# Patient Record
Sex: Female | Born: 1985 | Race: Black or African American | Hispanic: No | Marital: Single | Smoking: Current every day smoker
Health system: Southern US, Community
[De-identification: ages and names within clinical notes are randomized; demographics above are authoritative.]

## PROBLEM LIST (undated history)

## (undated) DIAGNOSIS — J40 Bronchitis, not specified as acute or chronic: Secondary | ICD-10-CM

---

## 2011-04-23 ENCOUNTER — Emergency Department (HOSPITAL_BASED_OUTPATIENT_CLINIC_OR_DEPARTMENT_OTHER)
Admission: EM | Admit: 2011-04-23 | Discharge: 2011-04-23 | Disposition: A | Discharge status: Home / Self Care | Payer: Medicaid Other | Attending: Emergency Medicine | Admitting: Emergency Medicine

## 2011-04-23 ENCOUNTER — Encounter: Payer: Self-pay | Admitting: *Deleted

## 2011-04-23 DIAGNOSIS — F17200 Nicotine dependence, unspecified, uncomplicated: Secondary | ICD-10-CM | POA: Insufficient documentation

## 2011-04-23 DIAGNOSIS — J40 Bronchitis, not specified as acute or chronic: Secondary | ICD-10-CM

## 2011-04-23 MED ORDER — AZITHROMYCIN 250 MG PO TABS: ORAL_TABLET | ORAL | Status: DC

## 2011-04-23 MED ORDER — AZITHROMYCIN 250 MG PO TABS: 250.0000 mg | ORAL_TABLET | Freq: Every day | ORAL | Status: DC

## 2011-04-23 NOTE — ED Notes (Signed)
Patient states that she has been having congestion/coughing for the past week, yesterday started having HA, chills, some nausea, took nyquil last week, no relief

## 2011-04-23 NOTE — ED Provider Notes (Signed)
History     CSN: [id number] Arrival date & time: 04/23/2011  9:30 AM   Chief Complaint  Patient presents with  . Headache     (Include location/radiation/quality/duration/timing/severity/associated sxs/prior treatment) HPI Patient states that she has had nasal congestion and cough for several days. She feels worse now with some pressure in her face scratchy throat and body aches. She has had some subjective fever and chills. She is a Lawyer. She denies nausea vomiting or diarrhea but states she has not felt like eating or drinking as much as usual. She is a smoker and has continued to smoke somewhat throughout this period  History reviewed. No pertinent past medical history.   History reviewed. No pertinent past surgical history.  No family history on file.  History  Substance Use Topics  . Smoking status: Current Some Day Smoker  . Smokeless tobacco: Not on file  . Alcohol Use: No    OB History    Grav Para Term Preterm Abortions TAB SAB Ect Mult Living                  Review of Systems  All other systems reviewed and are negative.    Allergies  Review of patient's allergies indicates no known allergies.  Home Medications   Current Outpatient Rx  Name Route Sig Dispense Refill  . ESTRADIOL CYPIONATE 5 MG/ML IM OIL Intramuscular Inject into the muscle every 28 (twenty-eight) days.        Physical Exam    BP 127/80  Pulse 91  Temp(Src) 99 F (37.2 C) (Oral)  Resp 19  SpO2 99%  Physical Exam Well-developed well-nourished female sitting that is not appear in any acute distress heart rate is 90 respiratory rate is 19 blood pressure is 127/80 she is afebrile and her pulse ox 99% on room air HEENT normocephalic atraumatic eyes equal round reactive to light extraocular movements are intact nares are patent mouth mucous membranes are moist and her pharynx is clear TMs are clear bilaterally she does have some tenderness over her frontal sinuses and maxillary  sinuses. Neck is supple with shotty adenopathy trachea is midline carotid pulses are 2+ and equally Lungs are clear to auscultation Heart is regular rate and rhythm Abdomen soft and nontender Extremities appear normal with full active range of motion Neurologically patient is alert and oriented x3 ED Course  Procedures  No results found for this or any previous visit. No results found.   No diagnosis found.   MDM Patient appears to have some bronchitis and possibly some sinusitis. In the face of being a smoker and the extended time that the symptoms have lasted with discoloration of sputum she was placed on a Z-Pak.       Hilario Quarry, MD 04/23/11 [phone]

## 2011-04-23 NOTE — ED Notes (Signed)
Pt verbalizes care plan well with need for smoke cesstation

## 2011-09-19 ENCOUNTER — Emergency Department (HOSPITAL_BASED_OUTPATIENT_CLINIC_OR_DEPARTMENT_OTHER)
Admission: EM | Admit: 2011-09-19 | Discharge: 2011-09-19 | Disposition: A | Discharge status: Home / Self Care | Payer: Medicaid Other | Attending: Emergency Medicine | Admitting: Emergency Medicine

## 2011-09-19 ENCOUNTER — Encounter (HOSPITAL_BASED_OUTPATIENT_CLINIC_OR_DEPARTMENT_OTHER): Payer: Self-pay | Admitting: Family Medicine

## 2011-09-19 DIAGNOSIS — F17200 Nicotine dependence, unspecified, uncomplicated: Secondary | ICD-10-CM | POA: Insufficient documentation

## 2011-09-19 DIAGNOSIS — J988 Other specified respiratory disorders: Secondary | ICD-10-CM | POA: Insufficient documentation

## 2011-09-19 DIAGNOSIS — J9801 Acute bronchospasm: Secondary | ICD-10-CM | POA: Insufficient documentation

## 2011-09-19 DIAGNOSIS — J3489 Other specified disorders of nose and nasal sinuses: Secondary | ICD-10-CM | POA: Insufficient documentation

## 2011-09-19 MED ORDER — ALBUTEROL SULFATE HFA 108 (90 BASE) MCG/ACT IN AERS: 2.0000 | INHALATION_SPRAY | RESPIRATORY_TRACT | Status: DC | PRN

## 2011-09-19 MED ORDER — TRAMADOL HCL 50 MG PO TABS: 50.0000 mg | ORAL_TABLET | Freq: Four times a day (QID) | ORAL | Status: AC | PRN

## 2011-09-19 NOTE — ED Provider Notes (Signed)
History     CSN: [id number]  Arrival date & time 09/19/11  1119   First MD Initiated Contact with Patient 09/19/11 1258      Chief Complaint  Patient presents with  . Cough  . Nasal Congestion  . Generalized Body Aches    (Consider location/radiation/quality/duration/timing/severity/associated sxs/prior treatment) Patient is a 26 y.o. female presenting with cough. The history is provided by the patient.  Cough  She has been sick for the last 4 days with symptoms of a bad cold or flu. She's had a cough productive of sputum of varying color. There's been associated dyspnea, and subjective fevers, chills, sweats. She is complaining of generalized myalgias and arthralgias. There's been some clear rhinorrhea. She's also had nausea and vomiting and diarrhea. Symptoms are severe. Nothing makes them better and nothing makes them worse. She's tried taking Mucinex with no relief. She denies sick contacts and states she did get the flu shot this year.  History reviewed. No pertinent past medical history.  History reviewed. No pertinent past surgical history.  No family history on file.  History  Substance Use Topics  . Smoking status: Current Some Day Smoker  . Smokeless tobacco: Not on file  . Alcohol Use: No    OB History    Grav Para Term Preterm Abortions TAB SAB Ect Mult Living                  Review of Systems  Respiratory: Positive for cough.   All other systems reviewed and are negative.    Allergies  Review of patient's allergies indicates no known allergies.  Home Medications   Current Outpatient Rx  Name Route Sig Dispense Refill  . ESTRADIOL CYPIONATE 5 MG/ML IM OIL Intramuscular Inject into the muscle every 28 (twenty-eight) days.      . AZITHROMYCIN 250 MG PO TABS  Two po day one then one a day for four days. 6 tablet 0    BP 121/70  Pulse 80  Temp(Src) 98.9 F (37.2 C) (Oral)  Resp 16  Ht 5\' 1"  (1.549 m)  Wt 165 lb (74.844 kg)  BMI 31.18 kg/m2   SpO2 100%  Physical Exam  Nursing note and vitals reviewed.  26 year old female who is resting comfortably and in no acute distress. Vital signs are normal. Oxygen saturation is 100% which is normal. Head is normocephalic and atraumatic. PERRLA, EOMI. TMs are clear. There is no sinus tenderness. Examination of the nasal cavity shows no purulent drainage. Oropharynx is clear and mucous membranes are moist. Neck is nontender and supple without adenopathy. Back is nontender. Lungs have no rales or wheezes. Coarse rhonchi are present which are worse with forced exhalation. There is no chest wall tenderness. Heart has regular rate rhythm without murmur. Abdomen is soft, flat, nontender without masses or hepatosplenomegaly. Extremities have no cyanosis or edema, full range of motion is present. Skin is warm and dry without rash. Neurologic: Mental status is normal, cranial nerves are intact, there no focal motor or sensory deficits.  ED Course  Procedures (including critical care time)   1. Respiratory tract infection   2. Bronchospasm       MDM  Upper respiratory infection which is probably influenza. She is outside of the window to start antiviral medication for influenza so will need to be treated symptomatically. She will be given a prescription for an albuterol inhaler and also a prescription for tramadol for pain. She was offered to be given an albuterol nebulizer treatment  in the emergency department but stated that she had to leave to pick up her child from school and could not get the treatment here.        Dione Booze, MD 09/19/11 [phone]

## 2011-09-19 NOTE — Discharge Instructions (Signed)
Take Tylenol and/or ibuprofen as needed for less severe pain.  Bronchospasm, Adult Bronchospasm means that there is a spasm or tightening of the airways going into the lungs. Because the airways go into a spasm and get smaller it makes breathing more difficult. For reasons not completely known, workings (functions) of the airways designed to protect the lungs become over active. This causes the airways to become more sensitive to:  Infection.   Weather.   Exercise.   Irritants.   Things that cause allergic reactions or allergies (allergens).  Frequent coughing or respiratory episodes should be checked for the cause. This condition may be made worse by exercise. CAUSES  Inflammation is often the cause of this condition. Allergy, viral respiratory infections, or irritants in the air often cause this problem. Allergic reactions produce immediate and delayed responses. Late reactions may produce more serious inflammation. This may lead to increased reactivity of the airways. Sometimes this is inherited. Some common triggers are:  Allergies.   Infection commonly triggers attacks. Antibiotics are not helpful for viral infections and usually do not help with attacks of bronchospasm.   Exercise (running, etc.) can trigger an attack. Proper pre-exercise medications help most individuals participate in sports. Swimming is the least likely sport to cause problems.   Irritants (for example, pollution, cigarette smoke, strong odors, aerosol sprays, paint fumes, etc.) may trigger attacks. You cannot smoke and do not allow smoking in your home. This is absolutely necessary. Show this instruction to mates, relatives and significant others that may not agree with you.   Weather changes may cause lung problems but moving around trying to find an ideal climate does not seem to be overly helpful. Winds increase molds and pollens in the air. Rain refreshes the air by washing irritants out. Cold air may cause  irritation.   Emotional problems do not cause lung problems but can trigger attacks.  SYMPTOMS  Wheezing is the most common symptom. Frequent coughing (with or without exercise and or crying) and repeated respiratory infections are all early warning signs of bronchospasm. Chest tightness and shortness of breath are other symptoms. DIAGNOSIS  Early hidden bronchospasm may go for long periods of time without being detected. This is especially true if wheezing cannot be detected by your caregiver. Lung (pulmonary) function studies may help with diagnosis in these cases. HOME CARE INSTRUCTIONS   It is necessary to remain calm during an attack. Try to relax and breathe more slowly. During this time medications may be given. If any breathing problems seem to be getting worse and are unresponsive to treatment seek immediate medical care.   If you have severe breathing difficulty or have had a life threatening attack it is probably a good idea for you to learn how to give adrenaline (epi-pen) or use an anaphylaxis kit. Your caregiver can help you with this. These are the same kits carried by people who have severe allergic reactions. This is especially important if you do not have readily accessible medical care.   With any severe breathing problems where epinephrine (adrenaline) has been given at home call 911 immediately as the delayed reaction may be even more severe.  SEEK MEDICAL CARE IF:   There is wheezing and shortness of breath, even if medications are given to prevent attacks.   An oral temperature above 102 F (38.9 C) develops.   There are muscle aches, chest pain, or thickening of sputum.   The sputum changes from clear or white to yellow, green, gray, or  bloody.   There are problems that may be related to the medicine you are given, such as a rash, itching, swelling, or trouble breathing.  SEEK IMMEDIATE MEDICAL CARE IF:   The usual medicines do not stop your wheezing, or there is  increased coughing.   You have increased difficulty breathing.  MAKE SURE YOU:   Understand these instructions.   Will watch your condition.   Will get help right away if you are not doing well or get worse.  Document Released: 07/27/2003 Document Revised: 04/05/2011 Document Reviewed: 03/11/2008 [facility] Patient Information 2012 [place], [place].  Upper Respiratory Infection, Adult An upper respiratory infection (URI) is also sometimes known as the common cold. The upper respiratory tract includes the nose, sinuses, throat, trachea, and bronchi. Bronchi are the airways leading to the lungs. Most people improve within 1 week, but symptoms can last up to 2 weeks. A residual cough may last even longer.  CAUSES Many different viruses can infect the tissues lining the upper respiratory tract. The tissues become irritated and inflamed and often become very moist. Mucus production is also common. A cold is contagious. You can easily spread the virus to others by oral contact. This includes kissing, sharing a glass, coughing, or sneezing. Touching your mouth or nose and then touching a surface, which is then touched by another person, can also spread the virus. SYMPTOMS  Symptoms typically develop 1 to 3 days after you come in contact with a cold virus. Symptoms vary from person to person. They may include:  Runny nose.   Sneezing.   Nasal congestion.   Sinus irritation.   Sore throat.   Loss of voice (laryngitis).   Cough.   Fatigue.   Muscle aches.   Loss of appetite.   Headache.   Low-grade fever.  DIAGNOSIS  You might diagnose your own cold based on familiar symptoms, since most people get a cold 2 to 3 times a year. Your caregiver can confirm this based on your exam. Most importantly, your caregiver can check that your symptoms are not due to another disease such as strep throat, sinusitis, pneumonia, asthma, or epiglottitis. Blood tests, throat tests, and X-rays are not  necessary to diagnose a common cold, but they may sometimes be helpful in excluding other more serious diseases. Your caregiver will decide if any further tests are required. RISKS AND COMPLICATIONS  You may be at risk for a more severe case of the common cold if you smoke cigarettes, have chronic heart disease (such as heart failure) or lung disease (such as asthma), or if you have a weakened immune system. The very young and very old are also at risk for more serious infections. Bacterial sinusitis, middle ear infections, and bacterial pneumonia can complicate the common cold. The common cold can worsen asthma and chronic obstructive pulmonary disease (COPD). Sometimes, these complications can require emergency medical care and may be life-threatening. PREVENTION  The best way to protect against getting a cold is to practice good hygiene. Avoid oral or hand contact with people with cold symptoms. Wash your hands often if contact occurs. There is no clear evidence that vitamin C, vitamin E, echinacea, or exercise reduces the chance of developing a cold. However, it is always recommended to get plenty of rest and practice good nutrition. TREATMENT  Treatment is directed at relieving symptoms. There is no cure. Antibiotics are not effective, because the infection is caused by a virus, not by bacteria. Treatment may include:  Increased fluid intake.  Sports drinks offer valuable electrolytes, sugars, and fluids.   Breathing heated mist or steam (vaporizer or shower).   Eating chicken soup or other clear broths, and maintaining good nutrition.   Getting plenty of rest.   Using gargles or lozenges for comfort.   Controlling fevers with ibuprofen or acetaminophen as directed by your caregiver.   Increasing usage of your inhaler if you have asthma.  Zinc gel and zinc lozenges, taken in the first 24 hours of the common cold, can shorten the duration and lessen the severity of symptoms. Pain medicines  may help with fever, muscle aches, and throat pain. A variety of non-prescription medicines are available to treat congestion and runny nose. Your caregiver can make recommendations and may suggest nasal or lung inhalers for other symptoms.  HOME CARE INSTRUCTIONS   Only take over-the-counter or prescription medicines for pain, discomfort, or fever as directed by your caregiver.   Use a warm mist humidifier or inhale steam from a shower to increase air moisture. This may keep secretions moist and make it easier to breathe.   Drink enough water and fluids to keep your urine clear or pale yellow.   Rest as needed.   Return to work when your temperature has returned to normal or as your caregiver advises. You may need to stay home longer to avoid infecting others. You can also use a face mask and careful hand washing to prevent spread of the virus.  SEEK MEDICAL CARE IF:   After the first few days, you feel you are getting worse rather than better.   You need your caregiver's advice about medicines to control symptoms.   You develop chills, worsening shortness of breath, or brown or red sputum. These may be signs of pneumonia.   You develop yellow or brown nasal discharge or pain in the face, especially when you bend forward. These may be signs of sinusitis.   You develop a fever, swollen neck glands, pain with swallowing, or white areas in the back of your throat. These may be signs of strep throat.  SEEK IMMEDIATE MEDICAL CARE IF:   You have a fever.   You develop severe or persistent headache, ear pain, sinus pain, or chest pain.   You develop wheezing, a prolonged cough, cough up blood, or have a change in your usual mucus (if you have chronic lung disease).   You develop sore muscles or a stiff neck.  Document Released: 01/17/2001 Document Revised: 04/05/2011 Document Reviewed: 11/25/2010 [facility] Patient Information 2012 [place], [place].  Cough, Adult  A cough is a reflex that  helps clear your throat and airways. It can help heal the body or may be a reaction to an irritated airway. A cough may only last 2 or 3 weeks (acute) or may last more than 8 weeks (chronic).  CAUSES Acute cough:  Viral or bacterial infections.  Chronic cough:  Infections.   Allergies.   Asthma.   Post-nasal drip.   Smoking.   Heartburn or acid reflux.   Some medicines.   Chronic lung problems (COPD).   Cancer.  SYMPTOMS   Cough.   Fever.   Chest pain.   Increased breathing rate.   High-pitched whistling sound when breathing (wheezing).   Colored mucus that you cough up (sputum).  TREATMENT   A bacterial cough may be treated with antibiotic medicine.   A viral cough must run its course and will not respond to antibiotics.   Your caregiver may recommend other treatments  if you have a chronic cough.  HOME CARE INSTRUCTIONS   Only take over-the-counter or prescription medicines for pain, discomfort, or fever as directed by your caregiver. Use cough suppressants only as directed by your caregiver.   Use a cold steam vaporizer or humidifier in your bedroom or home to help loosen secretions.   Sleep in a semi-upright position if your cough is worse at night.   Rest as needed.   Stop smoking if you smoke.  SEEK IMMEDIATE MEDICAL CARE IF:   You have pus in your sputum.   Your cough starts to worsen.   You cannot control your cough with suppressants and are losing sleep.   You begin coughing up blood.   You have difficulty breathing.   You develop pain which is getting worse or is uncontrolled with medicine.   You have a fever.  MAKE SURE YOU:   Understand these instructions.   Will watch your condition.   Will get help right away if you are not doing well or get worse.  Document Released: 01/20/2011 Document Revised: 04/05/2011 Document Reviewed: 01/20/2011 [facility] Patient Information 2012 [place], [place].  Smoking Hazards Smoking cigarettes  is extremely bad for your health. Tobacco smoke has over 200 known poisons in it. There are over 60 chemicals in tobacco smoke that cause cancer. Some of the chemicals found in cigarette smoke include:   Cyanide.   Benzene.   Formaldehyde.   Methanol (wood alcohol).   Acetylene (fuel used in welding torches).   Ammonia.  Cigarette smoke also contains the poisonous gases nitrogen oxide and carbon monoxide.  Cigarette smokers have an increased risk of many serious medical problems, including:  Lung cancer.   Lung disease (such as pneumonia, bronchitis, and emphysema).   Heart attack and chest pain due to the heart not getting enough oxygen (angina).   Heart disease and peripheral blood vessel disease.   Hypertension.   Stroke.   Oral cancer (cancer of the lip, mouth, or voice box).   Bladder cancer.   Pancreatic cancer.   Cervical cancer.   Pregnancy complications, including premature birth.   Low birthweight babies.   Early menopause.   Lower estrogen level for women.   Infertility.   Facial wrinkles.   Blindness.   Increased risk of broken bones (fractures).   Senile dementia.   Stillbirths and smaller newborn babies, birth defects, and genetic damage to sperm.   Stomach ulcers and internal bleeding.  Children of smokers have an increased risk of the following, because of secondhand smoke exposure:   Sudden infant death syndrome (SIDS).   Respiratory infections.   Lung cancer.   Heart disease.   Ear infections.  Smoking causes approximately:  90% of all lung cancer deaths in men.   80% of all lung cancer deaths in women.   90% of deaths from chronic obstructive lung disease.  Compared with nonsmokers, smoking increases the risk of:  Coronary heart disease by 2 to 4 times.   Stroke by 2 to 4 times.   Men developing lung cancer by 23 times.   Women developing lung cancer by 13 times.   Dying from chronic obstructive lung diseases by  12 times.  Someone who smokes 2 packs a day loses about 8 years of his or her life. Even smoking lightly shortens your life expectancy by several years. You can greatly reduce the risk of medical problems for you and your family by stopping now. Smoking is the most preventable cause of  death and disease in our society. Within days of quitting smoking, your circulation returns to normal, you decrease the risk of having a heart attack, and your lung capacity improves. There may be some increased phlegm in the first few days after quitting, and it may take months for your lungs to clear up completely. Quitting for 10 years cuts your lung cancer risk to almost that of a nonsmoker. WHY IS SMOKING ADDICTIVE?  Nicotine is the chemical agent in tobacco that is capable of causing addiction or dependence.   When you smoke and inhale, nicotine is absorbed rapidly into the bloodstream through your lungs. Nicotine absorbed through the lungs is capable of creating a powerful addiction. Both inhaled and non-inhaled nicotine may be addictive.   Addiction studies of cigarettes and spit tobacco show that addiction to nicotine occurs mainly during the teen years, when young people begin using tobacco products.  WHAT ARE THE BENEFITS OF QUITTING?  There are many health benefits to quitting smoking.   Likelihood of developing cancer and heart disease decreases. Health improvements are seen almost immediately.   Blood pressure, pulse rate, and breathing patterns start returning to normal soon after quitting.   People who quit may see an improvement in their overall quality of life.  Some people choose to quit all at once. Other options include nicotine replacement products, such as patches, gum, and nasal sprays. Do not use these products without first checking with your caregiver. QUITTING SMOKING It is not easy to quit smoking. Nicotine is addicting, and longtime habits are hard to change. To start, you can write  down all your reasons for quitting, tell your family and friends you want to quit, and ask for their help. Throw your cigarettes away, chew gum or cinnamon sticks, keep your hands busy, and drink extra water or juice. Go for walks and practice deep breathing to relax. Think of all the money you are saving: around $1,000 a year, for the average pack-a-day smoker. Nicotine patches and gum have been shown to improve success at efforts to stop smoking. Zyban (bupropion) is an anti-depressant drug that can be prescribed to reduce nicotine withdrawal symptoms and to suppress the urge to smoke. Smoking is an addiction with both physical and psychological effects. Joining a stop-smoking support group can help you cope with the emotional issues. For more information and advice on programs to stop smoking, call your doctor, your local hospital, or these organizations:  American Lung Association - 1-800-LUNGUSA   American Cancer Society - 1-800-ACS-2345  Document Released: 08/31/2004 Document Revised: 04/05/2011 Document Reviewed: 05/05/2009 [facility] Patient Information 2012 [place], [place].  Albumin Albumin tests are done as a screen for a liver disorder or kidney disease or to check nutritional status, especially in hospitalized patients (prealbumin is sometimes used instead of albumin in this situation). Albumin is the most plentiful protein in the blood plasma. It keeps fluid from leaking out of blood vessels; nourishes tissues; and transports hormones, vitamins, drugs, and ions like calcium throughout the body. Albumin is made in the liver and is extremely sensitive to liver damage. The concentration of albumin drops when the liver is damaged, with kidney disease (nephrotic syndrome), when a person is malnourished, if a person experiences inflammation in the body, or with shock. Albumin increases when a person is dehydrated. PREPARATION FOR TEST No preparation or fasting is necessary. A blood sample is taken  by a needle from a vein. Tell the person doing the test if you are pregnant. NORMAL FINDINGS  Adult/Elderly  Total Protein: 6.4-8.3 g/dL or 16-10R/U (SI units)   Albumin; 3.5-5 g/dL or 04-54 g/L (SI units)   Globulin 2.3-3.4 g/dL   Alpha1 globulin: 0.9-8 g/dL or 1-3 g/L (SI units)   Alpha2 globulin: 0.6-1 g/dL JX9-14 g/L (SI units)   Beta globulin: 0.7-1.1 g/dL or 7-82 g/L (SI units)  Children  Total protein.   Premature infant: 4.2-7.6 g/L   Newborn: 4.6-7.4 g/dL   Infant: 9-5.6 g/L   Albumin.   Premature infant: 3-4.2 g/dL   Newborn: 2.1-3.0 g/dL   Infant: 8.6-5.7 g/dL   Child: 8-4.6 g/dL  Ranges for normal findings may vary among different laboratories and hospitals. You should always check with your doctor after having lab work or other tests done to discuss the meaning of your test results and whether your values are considered within normal limits. MEANING OF TEST  Your caregiver will go over the test results with you and discuss the importance and meaning of your results, as well as treatment options and the need for additional tests if necessary. OBTAINING THE TEST RESULTS It is your responsibility to obtain your test results. Ask the lab or department performing the test when and how you will get your results. Document Released: 08/15/2004 Document Revised: 04/05/2011 Document Reviewed: 06/28/2008 [facility] Patient Information 2012 [place], [place].  Tramadol tablets What is this medicine? TRAMADOL (TRA ma dole) is a pain reliever. It is used to treat moderate to severe pain in adults. This medicine may be used for other purposes; ask your health care provider or pharmacist if you have questions. What should I tell my health care provider before I take this medicine? They need to know if you have any of these conditions: -brain tumor -depression -drug abuse or addiction -head injury -if you frequently drink alcohol containing drinks -kidney disease or  trouble passing urine -liver disease -lung disease, asthma, or breathing problems -seizures or epilepsy -suicidal thoughts, plans, or attempt; a previous suicide attempt by you or a family member -an unusual or allergic reaction to tramadol, codeine, other medicines, foods, dyes, or preservatives -pregnant or trying to get pregnant -breast-feeding How should I use this medicine? Take this medicine by mouth with a full glass of water. Follow the directions on the prescription label. If the medicine upsets your stomach, take it with food or milk. Do not take more medicine than you are told to take. Talk to your pediatrician regarding the use of this medicine in children. Special care may be needed. Overdosage: If you think you have taken too much of this medicine contact a poison control center or emergency room at once. NOTE: This medicine is only for you. Do not share this medicine with others. What if I miss a dose? If you miss a dose, take it as soon as you can. If it is almost time for your next dose, take only that dose. Do not take double or extra doses. What may interact with this medicine? Do not take this medicine with any of the following medications: -MAOIs like Carbex, Eldepryl, Marplan, Nardil, and Parnate This medicine may also interact with the following medications: -alcohol or medicines that contain alcohol -antihistamines -benzodiazepines -bupropion -carbamazepine or oxcarbazepine -clozapine -cyclobenzaprine -digoxin -furazolidone -linezolid -medicines for depression, anxiety, or psychotic disturbances -medicines for migraine headache like almotriptan, eletriptan, frovatriptan, naratriptan, rizatriptan, sumatriptan, zolmitriptan -medicines for pain like pentazocine, buprenorphine, butorphanol, meperidine, nalbuphine, and propoxyphene -medicines for sleep -muscle relaxants -naltrexone -phenobarbital -phenothiazines like perphenazine, thioridazine, chlorpromazine,  mesoridazine, fluphenazine, prochlorperazine,  promazine, and trifluoperazine -procarbazine -warfarin This list may not describe all possible interactions. Give your health care provider a list of all the medicines, herbs, non-prescription drugs, or dietary supplements you use. Also tell them if you smoke, drink alcohol, or use illegal drugs. Some items may interact with your medicine. What should I watch for while using this medicine? Tell your doctor or health care professional if your pain does not go away, if it gets worse, or if you have new or a different type of pain. You may develop tolerance to the medicine. Tolerance means that you will need a higher dose of the medicine for pain relief. Tolerance is normal and is expected if you take this medicine for a long time. Do not suddenly stop taking your medicine because you may develop a severe reaction. Your body becomes used to the medicine. This does NOT mean you are addicted. Addiction is a behavior related to getting and using a drug for a non-medical reason. If you have pain, you have a medical reason to take pain medicine. Your doctor will tell you how much medicine to take. If your doctor wants you to stop the medicine, the dose will be slowly lowered over time to avoid any side effects. You may get drowsy or dizzy. Do not drive, use machinery, or do anything that needs mental alertness until you know how this medicine affects you. Do not stand or sit up quickly, especially if you are an older patient. This reduces the risk of dizzy or fainting spells. Alcohol can increase or decrease the effects of this medicine. Avoid alcoholic drinks. You may have constipation. Try to have a bowel movement at least every 2 to 3 days. If you do not have a bowel movement for 3 days, call your doctor or health care professional. Your mouth may get dry. Chewing sugarless gum or sucking hard candy, and drinking plenty of water may help. Contact your doctor if the  problem does not go away or is severe. What side effects may I notice from receiving this medicine? Side effects that you should report to your doctor or health care professional as soon as possible: -allergic reactions like skin rash, itching or hives, swelling of the face, lips, or tongue -breathing difficulties, wheezing -confusion -itching -light headedness or fainting spells -redness, blistering, peeling or loosening of the skin, including inside the mouth -seizures Side effects that usually do not require medical attention (report to your doctor or health care professional if they continue or are bothersome): -constipation -dizziness -drowsiness -headache -nausea, vomiting This list may not describe all possible side effects. Call your doctor for medical advice about side effects. You may report side effects to FDA at 1-800-FDA-1088. Where should I keep my medicine? Keep out of the reach of children. Store at room temperature between 15 and 30 degrees C (59 and 86 degrees F). Keep container tightly closed. Throw away any unused medicine after the expiration date. NOTE: This sheet is a summary. It may not cover all possible information. If you have questions about this medicine, talk to your doctor, pharmacist, or health care provider.  2012, [employer] Standard. (04/06/2010 11:55:44 AM)

## 2011-09-19 NOTE — ED Notes (Signed)
Pt c/o cough, congestion, body aches since Friday. Pt reports taking multiple otc meds for same.

## 2012-03-14 ENCOUNTER — Emergency Department (HOSPITAL_BASED_OUTPATIENT_CLINIC_OR_DEPARTMENT_OTHER)
Admission: EM | Admit: 2012-03-14 | Discharge: 2012-03-14 | Disposition: A | Discharge status: Home / Self Care | Payer: Medicaid Other | Attending: Emergency Medicine | Admitting: Emergency Medicine

## 2012-03-14 ENCOUNTER — Encounter (HOSPITAL_BASED_OUTPATIENT_CLINIC_OR_DEPARTMENT_OTHER): Payer: Self-pay

## 2012-03-14 DIAGNOSIS — H00015 Hordeolum externum left lower eyelid: Secondary | ICD-10-CM

## 2012-03-14 DIAGNOSIS — H00019 Hordeolum externum unspecified eye, unspecified eyelid: Secondary | ICD-10-CM | POA: Insufficient documentation

## 2012-03-14 DIAGNOSIS — F17200 Nicotine dependence, unspecified, uncomplicated: Secondary | ICD-10-CM | POA: Insufficient documentation

## 2012-03-14 MED ORDER — FLUORESCEIN SODIUM 1 MG OP STRP
ORAL_STRIP | OPHTHALMIC | Status: AC
  Administered 2012-03-14: 1
  Filled 2012-03-14: qty 1

## 2012-03-14 MED ORDER — ERYTHROMYCIN 5 MG/GM OP OINT: TOPICAL_OINTMENT | OPHTHALMIC | Status: AC

## 2012-03-14 MED ORDER — TETRACAINE HCL 0.5 % OP SOLN
OPHTHALMIC | Status: AC
  Administered 2012-03-14: 16:00:00
  Filled 2012-03-14: qty 2

## 2012-03-14 NOTE — ED Provider Notes (Signed)
History     CSN: [id number]  Arrival date & time 03/14/12  1525   First MD Initiated Contact with Patient 03/14/12 1530      Chief Complaint  Patient presents with  . Eye Pain    (Consider location/radiation/quality/duration/timing/severity/associated sxs/prior treatment) Patient is a 26 y.o. female presenting with eye pain. The history is provided by the patient.  Eye Pain This is a new problem. The current episode started in the past 7 days. The problem occurs constantly. The problem has been unchanged. Associated symptoms include a visual change. Pertinent negatives include no chills, congestion, diaphoresis, fever, headaches, neck pain, numbness, rash, sore throat or vomiting.  Pt states left eye is tender, swollen. States swelling worse in the morning. No drainage. Pain worsened with blinking, palpation. Denies injury. Denies photophobia. Denies headache, nausea, vomiting. No hx of the same. Does not wear contacts or glasses.   History reviewed. No pertinent past medical history.  History reviewed. No pertinent past surgical history.  No family history on file.  History  Substance Use Topics  . Smoking status: Current Some Day Smoker  . Smokeless tobacco: Not on file  . Alcohol Use: No    OB History    Grav Para Term Preterm Abortions TAB SAB Ect Mult Living                  Review of Systems  Constitutional: Negative for fever, chills and diaphoresis.  HENT: Negative for congestion, sore throat, rhinorrhea, neck pain and neck stiffness.   Eyes: Positive for pain and visual disturbance. Negative for photophobia, discharge, redness and itching.  Respiratory: Negative.   Cardiovascular: Negative.   Gastrointestinal: Negative for vomiting.  Skin: Negative for rash.  Neurological: Negative for dizziness, numbness and headaches.    Allergies  Review of patient's allergies indicates no known allergies.  Home Medications   Current Outpatient Rx  Name Route Sig  Dispense Refill  . ALBUTEROL SULFATE HFA 108 (90 BASE) MCG/ACT IN AERS Inhalation Inhale 2 puffs into the lungs every 4 (four) hours as needed for wheezing or shortness of breath (or cough). 1 Inhaler 0  . AZITHROMYCIN 250 MG PO TABS  Two po day one then one a day for four days. 6 tablet 0  . ESTRADIOL CYPIONATE 5 MG/ML IM OIL Intramuscular Inject into the muscle every 28 (twenty-eight) days.        BP 126/71  Pulse 80  Temp 98.7 F (37.1 C) (Oral)  Resp 16  Ht 5\' 1"  (1.549 m)  Wt 165 lb (74.844 kg)  BMI 31.18 kg/m2  SpO2 98%  Physical Exam  Nursing note and vitals reviewed. Constitutional: She is oriented to person, place, and time. She appears well-developed and well-nourished. No distress.  HENT:  Head: Normocephalic and atraumatic.  Right Ear: External ear normal.  Left Ear: External ear normal.  Nose: Nose normal.  Mouth/Throat: Oropharynx is clear and moist.  Eyes: Conjunctivae and EOM are normal. Pupils are equal, round, and reactive to light.       There is a sty - area swelling, tenderness to the left lower medial eyelid. No drainage. Fluorescein stain of left eye negative  Neck: Neck supple.  Cardiovascular: Normal rate, regular rhythm and normal heart sounds.   Pulmonary/Chest: Effort normal and breath sounds normal. No respiratory distress. She has no wheezes. She has no rales.  Lymphadenopathy:    She has no cervical adenopathy.  Neurological: She is alert and oriented to person, place, and time.  Skin: Skin is warm and dry.  Psychiatric: She has a normal mood and affect.    ED Course  Procedures (including critical care time)  Exam consistent with a hordeolum of left eye. Negative fluorescein stain. Visual acuity normal. Will treat with erythromycin topical, warm compresses, if worsening follow up with ophthalmology. No evidence of orbital or periorbital cellulitis.   1. Hordeolum externum of left lower eyelid       MDM         Lottie Mussel, PA 03/14/12 2233

## 2012-03-14 NOTE — ED Notes (Signed)
C/o left eye pain, swelling x 4 days-denies injury

## 2012-03-16 NOTE — ED Provider Notes (Signed)
Medical screening examination/treatment/procedure(s) were performed by non-physician practitioner and as supervising physician I was immediately available for consultation/collaboration.   Shaletha Humble, MD 03/16/12 0720 

## 2012-08-05 ENCOUNTER — Encounter (HOSPITAL_BASED_OUTPATIENT_CLINIC_OR_DEPARTMENT_OTHER): Payer: Self-pay

## 2012-08-05 ENCOUNTER — Emergency Department (HOSPITAL_BASED_OUTPATIENT_CLINIC_OR_DEPARTMENT_OTHER)
Admission: EM | Admit: 2012-08-05 | Discharge: 2012-08-05 | Disposition: A | Discharge status: Home / Self Care | Payer: Medicaid Other | Attending: Emergency Medicine | Admitting: Emergency Medicine

## 2012-08-05 DIAGNOSIS — R1110 Vomiting, unspecified: Secondary | ICD-10-CM

## 2012-08-05 DIAGNOSIS — F17200 Nicotine dependence, unspecified, uncomplicated: Secondary | ICD-10-CM | POA: Insufficient documentation

## 2012-08-05 DIAGNOSIS — Z3202 Encounter for pregnancy test, result negative: Secondary | ICD-10-CM | POA: Insufficient documentation

## 2012-08-05 DIAGNOSIS — M549 Dorsalgia, unspecified: Secondary | ICD-10-CM

## 2012-08-05 DIAGNOSIS — R05 Cough: Secondary | ICD-10-CM | POA: Insufficient documentation

## 2012-08-05 DIAGNOSIS — IMO0002 Reserved for concepts with insufficient information to code with codable children: Secondary | ICD-10-CM | POA: Insufficient documentation

## 2012-08-05 DIAGNOSIS — R109 Unspecified abdominal pain: Secondary | ICD-10-CM | POA: Insufficient documentation

## 2012-08-05 DIAGNOSIS — B9789 Other viral agents as the cause of diseases classified elsewhere: Secondary | ICD-10-CM | POA: Insufficient documentation

## 2012-08-05 DIAGNOSIS — Z79899 Other long term (current) drug therapy: Secondary | ICD-10-CM | POA: Insufficient documentation

## 2012-08-05 DIAGNOSIS — R51 Headache: Secondary | ICD-10-CM | POA: Insufficient documentation

## 2012-08-05 DIAGNOSIS — B349 Viral infection, unspecified: Secondary | ICD-10-CM

## 2012-08-05 DIAGNOSIS — R059 Cough, unspecified: Secondary | ICD-10-CM | POA: Insufficient documentation

## 2012-08-05 DIAGNOSIS — R0602 Shortness of breath: Secondary | ICD-10-CM | POA: Insufficient documentation

## 2012-08-05 DIAGNOSIS — IMO0001 Reserved for inherently not codable concepts without codable children: Secondary | ICD-10-CM | POA: Insufficient documentation

## 2012-08-05 LAB — URINALYSIS, ROUTINE W REFLEX MICROSCOPIC
Glucose, UA: NEGATIVE mg/dL
Leukocytes, UA: NEGATIVE
Protein, ur: NEGATIVE mg/dL
Urobilinogen, UA: 1 mg/dL (ref 0.0–1.0)

## 2012-08-05 LAB — URINE MICROSCOPIC-ADD ON

## 2012-08-05 LAB — PREGNANCY, URINE: Preg Test, Ur: NEGATIVE

## 2012-08-05 MED ORDER — ACETAMINOPHEN-CODEINE #3 300-30 MG PO TABS: 1.0000 | ORAL_TABLET | Freq: Four times a day (QID) | ORAL | Status: DC | PRN

## 2012-08-05 MED ORDER — ACETAMINOPHEN 325 MG PO TABS
650.0000 mg | ORAL_TABLET | Freq: Once | ORAL | Status: AC
  Administered 2012-08-05: 650 mg via ORAL
  Filled 2012-08-05: qty 1

## 2012-08-05 MED ORDER — CYCLOBENZAPRINE HCL 10 MG PO TABS: 10.0000 mg | ORAL_TABLET | Freq: Two times a day (BID) | ORAL | Status: DC | PRN

## 2012-08-05 MED ORDER — IBUPROFEN 400 MG PO TABS
600.0000 mg | ORAL_TABLET | Freq: Once | ORAL | Status: AC
  Administered 2012-08-05: 600 mg via ORAL
  Filled 2012-08-05: qty 1

## 2012-08-05 NOTE — ED Notes (Signed)
C/o vomiting, HA, back pain x 3 days

## 2012-08-05 NOTE — ED Provider Notes (Addendum)
History  This chart was scribed for Tobin Chad, MD by Manuela Schwartz, ED scribe. This patient was seen in room MH02/MH02 and the patient's care was started at 1902.   CSN: [id number]  Arrival date & time 08/05/12  1902   First MD Initiated Contact with Patient 08/05/12 2141      Chief Complaint  Patient presents with  . Emesis   Patient is a 26 y.o. female presenting with vomiting. The history is provided by the patient. No language interpreter was used.  Emesis  This is a new problem. The current episode started more than 2 days ago. The problem occurs 2 to 4 times per day. The problem has not changed since onset.The emesis has an appearance of stomach contents. There has been no fever. Associated symptoms include abdominal pain, cough and headaches. Pertinent negatives include no chills, no diarrhea and no fever.   Kristy Byrd is a 26 y.o. female who presents to the Emergency Department complaining of multiple medical complaints including constant gradually body aches, emesis, and mild cough. She states over the past 3 days has been having HA, entire upper and lower back pain, multiple emesis episodes, nausea, emesis, SOB, upper diffuse abdominal pain. She is on depo provera, has no chronic medical problems. She denies fever, rhinorrhea, sore throat, diarrhea. She has no sick contacts, or allergies.   She has no PCP  History reviewed. No pertinent past medical history.  History reviewed. No pertinent past surgical history.  No family history on file.  History  Substance Use Topics  . Smoking status: Current Some Day Smoker  . Smokeless tobacco: Not on file  . Alcohol Use: No    OB History    Grav Para Term Preterm Abortions TAB SAB Ect Mult Living                  Review of Systems  Constitutional: Negative for fever and chills.  HENT: Negative for congestion, sore throat, rhinorrhea, trouble swallowing, neck pain and neck stiffness.   Respiratory: Positive for  cough. Negative for chest tightness and shortness of breath.   Cardiovascular: Negative for chest pain.  Gastrointestinal: Positive for vomiting and abdominal pain. Negative for nausea and diarrhea.  Genitourinary: Negative for dysuria, urgency, hematuria, flank pain and pelvic pain.  Musculoskeletal: Positive for back pain.  Skin: Negative for color change, rash and wound.  Neurological: Positive for headaches. Negative for weakness.  All other systems reviewed and are negative.    Allergies  Review of patient's allergies indicates no known allergies.  Home Medications   Current Outpatient Rx  Name  Route  Sig  Dispense  Refill  . ALBUTEROL SULFATE HFA 108 (90 BASE) MCG/ACT IN AERS   Inhalation   Inhale 2 puffs into the lungs every 4 (four) hours as needed for wheezing or shortness of breath (or cough).   1 Inhaler   0   . AZITHROMYCIN 250 MG PO TABS      Two po day one then one a day for four days.   6 tablet   0   . ESTRADIOL CYPIONATE 5 MG/ML IM OIL   Intramuscular   Inject into the muscle every 28 (twenty-eight) days.             Triage Vitals: BP 124/85  Pulse 93  Temp 98.2 F (36.8 C) (Oral)  Resp 16  Wt 167 lb (75.751 kg)  SpO2 100%  Physical Exam  Nursing note and vitals reviewed. Constitutional:  She is oriented to person, place, and time. She appears well-developed and well-nourished. No distress.  HENT:  Head: Normocephalic and atraumatic.  Mouth/Throat: Oropharynx is clear and moist.       Posterior pharynx is clear  Eyes: EOM are normal.  Neck: Neck supple. No JVD present. No tracheal deviation present.  Cardiovascular: Normal rate, regular rhythm and normal heart sounds.   No murmur heard.      Normal S1/S2  Pulmonary/Chest: Effort normal. No respiratory distress. She has no wheezes. She has no rales.  Abdominal: Soft. Bowel sounds are normal. She exhibits no distension and no mass. There is no tenderness. There is no rebound and no guarding.    Musculoskeletal: Normal range of motion. She exhibits no edema and no tenderness.       Cervical back: Normal.       Thoracic back: Normal.       Lumbar back: Normal.       No midline back tenderness or step offs  Neurological: She is alert and oriented to person, place, and time. Coordination (note steady, confident gait) normal.  Skin: Skin is warm and dry. No rash noted. No erythema.  Psychiatric: She has a normal mood and affect. Her behavior is normal.    ED Course  Procedures (including critical care time) DIAGNOSTIC STUDIES: Oxygen Saturation is 100% on room air, normal by my interpretation.    COORDINATION OF CARE: At 1000 PM Discussed treatment plan with patient which includes UA, preg UA. Patient agrees.   Labs Reviewed  URINALYSIS, ROUTINE W REFLEX MICROSCOPIC - Abnormal; Notable for the following:    APPearance CLOUDY (*)     Hgb urine dipstick LARGE (*)     All other components within normal limits  URINE MICROSCOPIC-ADD ON - Abnormal; Notable for the following:    Squamous Epithelial / LPF FEW (*)     Bacteria, UA FEW (*)     Crystals CA OXALATE CRYSTALS (*)     All other components within normal limits  PREGNANCY, URINE   No results found.   No diagnosis found.    MDM  Pt appears nontoxic, NAD.  Note stable VS.  She has no reproducible midline back tenderness.  She describes diffuse myalgias and nonspecific abdominal tenderness.  She has no reproducible abdominal pain.  Suspect she has an early viral syndrome.  Plan symptomatic care - phenergan, flexeril, T#3.  She has been instructed to return to the ER if her symptoms worsen.  Note small amt of blood in the urinalysis.  Pt has no clinical evidence of ureteral colic or pyelonephritis.    I personally performed the services described in this documentation, which was scribed in my presence. The recorded information has been reviewed and is accurate.     Tobin Chad, MD 08/05/12 0981  Tobin Chad, MD 08/05/12 2224

## 2013-10-14 DIAGNOSIS — Z79899 Other long term (current) drug therapy: Secondary | ICD-10-CM | POA: Insufficient documentation

## 2013-10-14 DIAGNOSIS — Z3202 Encounter for pregnancy test, result negative: Secondary | ICD-10-CM | POA: Insufficient documentation

## 2013-10-14 DIAGNOSIS — J159 Unspecified bacterial pneumonia: Secondary | ICD-10-CM | POA: Insufficient documentation

## 2013-10-14 DIAGNOSIS — M5380 Other specified dorsopathies, site unspecified: Secondary | ICD-10-CM | POA: Insufficient documentation

## 2013-10-14 DIAGNOSIS — F17200 Nicotine dependence, unspecified, uncomplicated: Secondary | ICD-10-CM | POA: Insufficient documentation

## 2013-10-14 DIAGNOSIS — Z792 Long term (current) use of antibiotics: Secondary | ICD-10-CM | POA: Insufficient documentation

## 2013-10-15 ENCOUNTER — Emergency Department (HOSPITAL_BASED_OUTPATIENT_CLINIC_OR_DEPARTMENT_OTHER): Payer: Medicaid Other

## 2013-10-15 ENCOUNTER — Emergency Department (HOSPITAL_BASED_OUTPATIENT_CLINIC_OR_DEPARTMENT_OTHER)
Admission: EM | Admit: 2013-10-15 | Discharge: 2013-10-15 | Disposition: A | Discharge status: Home / Self Care | Payer: Medicaid Other | Attending: Emergency Medicine | Admitting: Emergency Medicine

## 2013-10-15 ENCOUNTER — Encounter (HOSPITAL_BASED_OUTPATIENT_CLINIC_OR_DEPARTMENT_OTHER): Payer: Self-pay | Admitting: Emergency Medicine

## 2013-10-15 DIAGNOSIS — M62830 Muscle spasm of back: Secondary | ICD-10-CM

## 2013-10-15 DIAGNOSIS — J189 Pneumonia, unspecified organism: Secondary | ICD-10-CM

## 2013-10-15 LAB — PREGNANCY, URINE: PREG TEST UR: NEGATIVE

## 2013-10-15 MED ORDER — AMOXICILLIN-POT CLAVULANATE 875-125 MG PO TABS
ORAL_TABLET | ORAL | Status: AC
  Filled 2013-10-15: qty 1

## 2013-10-15 MED ORDER — METHOCARBAMOL 500 MG PO TABS
1000.0000 mg | ORAL_TABLET | Freq: Once | ORAL | Status: AC
  Administered 2013-10-15: 1000 mg via ORAL

## 2013-10-15 MED ORDER — AMOXICILLIN-POT CLAVULANATE 875-125 MG PO TABS
1.0000 | ORAL_TABLET | Freq: Once | ORAL | Status: AC
  Administered 2013-10-15: 1 via ORAL

## 2013-10-15 MED ORDER — METHOCARBAMOL 500 MG PO TABS
ORAL_TABLET | ORAL | Status: AC
  Filled 2013-10-15: qty 2

## 2013-10-15 MED ORDER — NAPROXEN 250 MG PO TABS
ORAL_TABLET | ORAL | Status: AC
  Filled 2013-10-15: qty 2

## 2013-10-15 MED ORDER — AMOXICILLIN-POT CLAVULANATE 875-125 MG PO TABS: 1.0000 | ORAL_TABLET | Freq: Two times a day (BID) | ORAL | Status: DC

## 2013-10-15 MED ORDER — METHOCARBAMOL 500 MG PO TABS: 500.0000 mg | ORAL_TABLET | Freq: Two times a day (BID) | ORAL | Status: DC

## 2013-10-15 MED ORDER — HYDROCODONE-ACETAMINOPHEN 5-325 MG PO TABS: 1.0000 | ORAL_TABLET | Freq: Four times a day (QID) | ORAL | Status: DC | PRN

## 2013-10-15 MED ORDER — NAPROXEN 250 MG PO TABS
500.0000 mg | ORAL_TABLET | Freq: Once | ORAL | Status: AC
Start: 2013-10-15 — End: 2013-10-15
  Administered 2013-10-15: 500 mg via ORAL

## 2013-10-15 NOTE — ED Notes (Signed)
Mid left sided back pain started 1 hr ago.  No known injury. Tender to touch.  Worse with breathing and movement. No hx of back pain.

## 2013-10-15 NOTE — ED Provider Notes (Signed)
CSN: [id number]     Arrival date & time 10/14/13  2351 History   First MD Initiated Contact with Patient 10/15/13 0130     Chief Complaint  Patient presents with  . Back Pain     (Consider location/radiation/quality/duration/timing/severity/associated sxs/prior Treatment) Patient is a 28 y.o. female presenting with cough. The history is provided by the patient.  Cough Cough characteristics:  Productive Severity:  Moderate Onset quality:  Gradual Duration:  3 weeks Timing:  Intermittent Progression:  Unchanged Chronicity:  New Smoker: yes   Context: not sick contacts   Relieved by:  Nothing Worsened by:  Nothing tried Ineffective treatments:  None tried Associated symptoms: no chest pain, no shortness of breath and no wheezing   Associated symptoms comment:  Also having back pain lateral to T6 on the left that is worse with movement and cough, denies trauma Risk factors: no recent travel     History reviewed. No pertinent past medical history. History reviewed. No pertinent past surgical history. No family history on file. History  Substance Use Topics  . Smoking status: Current Every Day Smoker    Types: Cigars  . Smokeless tobacco: Not on file  . Alcohol Use: No   OB History   Grav Para Term Preterm Abortions TAB SAB Ect Mult Living                 Review of Systems  Respiratory: Positive for cough. Negative for chest tightness, shortness of breath, wheezing and stridor.   Cardiovascular: Negative for chest pain, palpitations and leg swelling.  All other systems reviewed and are negative.      Allergies  Betadine  Home Medications   Current Outpatient Rx  Name  Route  Sig  Dispense  Refill  . acetaminophen-codeine (TYLENOL #3) 300-30 MG per tablet   Oral   Take 1-2 tablets by mouth every 6 (six) hours as needed for pain.   15 tablet   0   . EXPIRED: albuterol (PROVENTIL HFA;VENTOLIN HFA) 108 (90 BASE) MCG/ACT inhaler   Inhalation   Inhale 2 puffs  into the lungs every 4 (four) hours as needed for wheezing or shortness of breath (or cough).   1 Inhaler   0   . azithromycin (ZITHROMAX Z-PAK) 250 MG tablet      Two po day one then one a day for four days.   6 tablet   0   . cyclobenzaprine (FLEXERIL) 10 MG tablet   Oral   Take 1 tablet (10 mg total) by mouth 2 (two) times daily as needed for muscle spasms.   20 tablet   0   . estradiol cypionate (DEPO-ESTRADIOL) 5 MG/ML injection   Intramuscular   Inject into the muscle every 28 (twenty-eight) days.            BP 122/79  Pulse 82  Temp(Src) 98.6 F (37 C) (Oral)  Resp 18  Ht 5\' 1"  (1.549 m)  Wt 160 lb (72.576 kg)  BMI 30.25 kg/m2  SpO2 100% Physical Exam  Constitutional: She is oriented to person, place, and time. She appears well-developed and well-nourished. No distress.  HENT:  Head: Normocephalic and atraumatic.  Mouth/Throat: Oropharynx is clear and moist.  Eyes: Conjunctivae are normal. Pupils are equal, round, and reactive to light.  Neck: Normal range of motion. Neck supple.  Cardiovascular: Normal rate, regular rhythm, normal heart sounds and intact distal pulses.   Pulmonary/Chest: Effort normal and breath sounds normal. No respiratory distress. She has no  wheezes. She has no rales.    Abdominal: Soft. Bowel sounds are normal. There is no tenderness. There is no rebound and no guarding.  Musculoskeletal: Normal range of motion.  Neurological: She is alert and oriented to person, place, and time. She has normal reflexes.  Skin: Skin is warm and dry.  Psychiatric: She has a normal mood and affect.    ED Course  Procedures (including critical care time) Labs Review Labs Reviewed  PREGNANCY, URINE   Imaging Review No results found.   EKG Interpretation None      MDM   Final diagnoses:  None    PERC negative wells 0  Will treat for PNA in the setting of pain and productive cough.  Follow up with your PMD for ongoing care return for  worsening symptoms    Seung Nidiffer K Yavier Snider-Rasch, MD 10/15/13 [phone]

## 2014-03-11 ENCOUNTER — Emergency Department (HOSPITAL_BASED_OUTPATIENT_CLINIC_OR_DEPARTMENT_OTHER)
Admission: EM | Admit: 2014-03-11 | Discharge: 2014-03-11 | Disposition: A | Discharge status: Home / Self Care | Payer: Medicaid Other | Attending: Emergency Medicine | Admitting: Emergency Medicine

## 2014-03-11 ENCOUNTER — Emergency Department (HOSPITAL_BASED_OUTPATIENT_CLINIC_OR_DEPARTMENT_OTHER): Payer: Medicaid Other

## 2014-03-11 ENCOUNTER — Encounter (HOSPITAL_BASED_OUTPATIENT_CLINIC_OR_DEPARTMENT_OTHER): Payer: Self-pay | Admitting: Emergency Medicine

## 2014-03-11 DIAGNOSIS — S92919A Unspecified fracture of unspecified toe(s), initial encounter for closed fracture: Secondary | ICD-10-CM | POA: Insufficient documentation

## 2014-03-11 DIAGNOSIS — S99929A Unspecified injury of unspecified foot, initial encounter: Secondary | ICD-10-CM

## 2014-03-11 DIAGNOSIS — S92911A Unspecified fracture of right toe(s), initial encounter for closed fracture: Secondary | ICD-10-CM

## 2014-03-11 DIAGNOSIS — Y9389 Activity, other specified: Secondary | ICD-10-CM | POA: Insufficient documentation

## 2014-03-11 DIAGNOSIS — Z79899 Other long term (current) drug therapy: Secondary | ICD-10-CM | POA: Diagnosis not present

## 2014-03-11 DIAGNOSIS — S8990XA Unspecified injury of unspecified lower leg, initial encounter: Secondary | ICD-10-CM | POA: Insufficient documentation

## 2014-03-11 DIAGNOSIS — W2203XA Walked into furniture, initial encounter: Secondary | ICD-10-CM | POA: Insufficient documentation

## 2014-03-11 DIAGNOSIS — Y929 Unspecified place or not applicable: Secondary | ICD-10-CM | POA: Insufficient documentation

## 2014-03-11 DIAGNOSIS — F17200 Nicotine dependence, unspecified, uncomplicated: Secondary | ICD-10-CM | POA: Insufficient documentation

## 2014-03-11 DIAGNOSIS — Z792 Long term (current) use of antibiotics: Secondary | ICD-10-CM | POA: Insufficient documentation

## 2014-03-11 DIAGNOSIS — S99919A Unspecified injury of unspecified ankle, initial encounter: Secondary | ICD-10-CM

## 2014-03-11 MED ORDER — HYDROCODONE-ACETAMINOPHEN 5-325 MG PO TABS: 2.0000 | ORAL_TABLET | ORAL | Status: DC | PRN

## 2014-03-11 NOTE — ED Notes (Signed)
Hit right pinky toe on corner of a chair yesterday

## 2014-03-11 NOTE — ED Notes (Signed)
MD at bedside. 

## 2014-03-11 NOTE — ED Provider Notes (Signed)
CSN: [id number]     Arrival date & time 03/11/14  2031 History  This chart was scribed for No att. providers found by Carl Bestelina Holson, ED Scribe. This patient was seen in room MHOTF/OTF and the patient's care was started at 9:22 PM.     Chief Complaint  Patient presents with  . Toe Injury    The history is provided by the patient. No language interpreter was used.   HPI Comments: Kristy Byrd is a 28 y.o. female who presents to the Emergency Department complaining of constant, throbbing pain to his fifth right toe that started last night after he hit his toe on the corner of a chair.  She did not have a shoe on at the time of the incident.  She states that her right foot and ankle are swollen.  She denies numbness and tingling in her right foot as an associated symptom.  She has not taken any medication for her symptoms.  She does not have any medical problems or take any medications.  She does not have any allergies to medications.  She does not have a history of DM.    History reviewed. No pertinent past medical history. History reviewed. No pertinent past surgical history. No family history on file. History  Substance Use Topics  . Smoking status: Current Every Day Smoker    Types: Cigars  . Smokeless tobacco: Not on file  . Alcohol Use: No   OB History   Grav Para Term Preterm Abortions TAB SAB Ect Mult Living                 Review of Systems A complete 10 system review of systems was obtained and all systems are negative except as noted in the HPI and PMH.     Allergies  Betadine  Home Medications   Prior to Admission medications   Medication Sig Start Date End Date Taking? Authorizing Provider  acetaminophen-codeine (TYLENOL #3) 300-30 MG per tablet Take 1-2 tablets by mouth every 6 (six) hours as needed for pain. 08/05/12   Tobin ChadMarwan T Powers, MD  albuterol (PROVENTIL HFA;VENTOLIN HFA) 108 (90 BASE) MCG/ACT inhaler Inhale 2 puffs into the lungs every 4 (four) hours as  needed for wheezing or shortness of breath (or cough). 09/19/11 09/18/12  Dione Boozeavid Glick, MD  amoxicillin-clavulanate (AUGMENTIN) 875-125 MG per tablet Take 1 tablet by mouth 2 (two) times daily. One po bid x 10 days 10/15/13   April K Palumbo-Rasch, MD  azithromycin (ZITHROMAX Z-PAK) 250 MG tablet Two po day one then one a day for four days. 04/23/11   Hilario Quarryanielle S Ray, MD  cyclobenzaprine (FLEXERIL) 10 MG tablet Take 1 tablet (10 mg total) by mouth 2 (two) times daily as needed for muscle spasms. 08/05/12   Tobin ChadMarwan T Powers, MD  estradiol cypionate (DEPO-ESTRADIOL) 5 MG/ML injection Inject into the muscle every 28 (twenty-eight) days.      Historical Provider, MD  HYDROcodone-acetaminophen (NORCO) 5-325 MG per tablet Take 1 tablet by mouth every 6 (six) hours as needed. 10/15/13   April K Palumbo-Rasch, MD  HYDROcodone-acetaminophen (NORCO/VICODIN) 5-325 MG per tablet Take 2 tablets by mouth every 4 (four) hours as needed. 03/11/14   Glynn OctaveStephen Ambriana Selway, MD  methocarbamol (ROBAXIN) 500 MG tablet Take 1 tablet (500 mg total) by mouth 2 (two) times daily. 10/15/13   April K Palumbo-Rasch, MD   Triage Vitals: BP 134/80  Pulse 85  Temp(Src) 98.2 F (36.8 C) (Oral)  Resp 16  Ht 5\' 1"  (  1.549 m)  Wt 160 lb (72.576 kg)  BMI 30.25 kg/m2  SpO2 100%  Physical Exam  Nursing note and vitals reviewed. Constitutional: She is oriented to person, place, and time. She appears well-developed and well-nourished. No distress.  HENT:  Head: Normocephalic and atraumatic.  Mouth/Throat: Oropharynx is clear and moist. No oropharyngeal exudate.  Eyes: Conjunctivae and EOM are normal. Pupils are equal, round, and reactive to light.  Neck: Normal range of motion. Neck supple.  No meningismus.  Cardiovascular: Normal rate, regular rhythm, normal heart sounds and intact distal pulses.   No murmur heard. Pulmonary/Chest: Effort normal and breath sounds normal. No respiratory distress.  Abdominal: Soft. There is no tenderness. There  is no rebound and no guarding.  Musculoskeletal: Normal range of motion. She exhibits no edema and no tenderness.  Right foot - Swelling and tenderness to the right fifth toe.  No open wounds.  No pain at base of fifth metatarsal.  Full range of motion of the ankle.  Intact DP and PT pulses.   Neurological: She is alert and oriented to person, place, and time. No cranial nerve deficit. She exhibits normal muscle tone. Coordination normal.  No ataxia on finger to nose bilaterally. No pronator drift. 5/5 strength throughout. CN 2-12 intact. Negative Romberg. Equal grip strength. Sensation intact. Gait is normal.   Skin: Skin is warm.  Psychiatric: She has a normal mood and affect. Her behavior is normal.    ED Course  Procedures (including critical care time)  DIAGNOSTIC STUDIES: Oxygen Saturation is 100% on room air, normal by my interpretation.    COORDINATION OF CARE: 9:24 PM- Discussed splinting the patient's toe and discharging the patient with pain medication.  Advised the patient to wear hard-soled shoes and ice the toe for 20 minutes every couple of hours.  The patient agreed to the treatment plan.     Labs Review Labs Reviewed - No data to display  Imaging Review Dg Toe 5th Right  03/11/2014   CLINICAL DATA:  Toe injury, pain  EXAM: RIGHT FIFTH TOE  COMPARISON:  None.  FINDINGS: Nondisplaced transverse fracture in the distal fifth phalanx. No arthropathy  IMPRESSION: Nondisplaced fracture distal fifth phalanx.   Electronically Signed   By: Marlan Palau M.D.   On: 03/11/2014 21:12     EKG Interpretation None      MDM   Final diagnoses:  Toe fracture, right, closed, initial encounter   Right small toe pain since yesterday after hitting it on a piece of furniture. No other injuries. No open wounds.  X-ray shows nondisplaced distal fifth phalanx fracture. No tenderness to base of fifth metatarsal. We'll buddy tape, cast shoe, pain meds, followup with PCP  I personally  performed the services described in this documentation, which was scribed in my presence. The recorded information has been reviewed and is accurate.   Glynn Octave, MD 03/12/14 [phone]

## 2014-03-11 NOTE — Discharge Instructions (Signed)

## 2014-04-23 ENCOUNTER — Emergency Department (HOSPITAL_BASED_OUTPATIENT_CLINIC_OR_DEPARTMENT_OTHER): Payer: Medicaid Other

## 2014-04-23 ENCOUNTER — Emergency Department (HOSPITAL_BASED_OUTPATIENT_CLINIC_OR_DEPARTMENT_OTHER)
Admission: EM | Admit: 2014-04-23 | Discharge: 2014-04-23 | Disposition: A | Discharge status: Home / Self Care | Payer: Medicaid Other | Attending: Emergency Medicine | Admitting: Emergency Medicine

## 2014-04-23 ENCOUNTER — Encounter (HOSPITAL_BASED_OUTPATIENT_CLINIC_OR_DEPARTMENT_OTHER): Payer: Self-pay | Admitting: Emergency Medicine

## 2014-04-23 DIAGNOSIS — Z79899 Other long term (current) drug therapy: Secondary | ICD-10-CM | POA: Diagnosis not present

## 2014-04-23 DIAGNOSIS — Z792 Long term (current) use of antibiotics: Secondary | ICD-10-CM | POA: Insufficient documentation

## 2014-04-23 DIAGNOSIS — J069 Acute upper respiratory infection, unspecified: Secondary | ICD-10-CM | POA: Insufficient documentation

## 2014-04-23 DIAGNOSIS — R059 Cough, unspecified: Secondary | ICD-10-CM | POA: Diagnosis present

## 2014-04-23 DIAGNOSIS — F17200 Nicotine dependence, unspecified, uncomplicated: Secondary | ICD-10-CM | POA: Insufficient documentation

## 2014-04-23 DIAGNOSIS — R05 Cough: Secondary | ICD-10-CM | POA: Insufficient documentation

## 2014-04-23 HISTORY — DX: Bronchitis, not specified as acute or chronic: J40

## 2014-04-23 MED ORDER — ALBUTEROL SULFATE HFA 108 (90 BASE) MCG/ACT IN AERS
2.0000 | INHALATION_SPRAY | RESPIRATORY_TRACT | Status: DC | PRN
  Administered 2014-04-23: 2 via RESPIRATORY_TRACT
  Filled 2014-04-23: qty 6.7

## 2014-04-23 MED ORDER — IPRATROPIUM-ALBUTEROL 0.5-2.5 (3) MG/3ML IN SOLN
3.0000 mL | Freq: Once | RESPIRATORY_TRACT | Status: AC
  Administered 2014-04-23: 3 mL via RESPIRATORY_TRACT
  Filled 2014-04-23: qty 3

## 2014-04-23 MED ORDER — ALBUTEROL SULFATE (2.5 MG/3ML) 0.083% IN NEBU
2.5000 mg | INHALATION_SOLUTION | Freq: Once | RESPIRATORY_TRACT | Status: AC
Start: 2014-04-23 — End: 2014-04-23
  Administered 2014-04-23: 2.5 mg via RESPIRATORY_TRACT
  Filled 2014-04-23: qty 3

## 2014-04-23 MED ORDER — GUAIFENESIN-CODEINE 100-10 MG/5ML PO SOLN: 10.0000 mL | Freq: Four times a day (QID) | ORAL | Status: DC | PRN

## 2014-04-23 NOTE — ED Provider Notes (Signed)
CSN: [id number]     Arrival date & time 04/23/14  1006 History   First MD Initiated Contact with Patient 04/23/14 1011     Chief Complaint  Patient presents with  . Cough     (Consider location/radiation/quality/duration/timing/severity/associated sxs/prior Treatment) Patient is a 28 y.o. female presenting with cough. The history is provided by the patient.  Cough Cough characteristics:  Productive Sputum characteristics:  Yellow Severity:  Moderate Onset quality:  Sudden Duration:  2 days Timing:  Constant Progression:  Worsening Chronicity:  New Smoker: yes   Relieved by:  Nothing Worsened by:  Nothing tried Ineffective treatments:  None tried Associated symptoms: chills, fever and wheezing   Associated symptoms: no chest pain     Past Medical History  Diagnosis Date  . Bronchitis    History reviewed. No pertinent past surgical history. No family history on file. History  Substance Use Topics  . Smoking status: Current Every Day Smoker    Types: Cigars  . Smokeless tobacco: Not on file  . Alcohol Use: No   OB History   Grav Para Term Preterm Abortions TAB SAB Ect Mult Living                 Review of Systems  Constitutional: Positive for fever and chills.  Respiratory: Positive for cough and wheezing.   Cardiovascular: Negative for chest pain.  All other systems reviewed and are negative.     Allergies  Betadine  Home Medications   Prior to Admission medications   Medication Sig Start Date End Date Taking? Authorizing Provider  acetaminophen-codeine (TYLENOL #3) 300-30 MG per tablet Take 1-2 tablets by mouth every 6 (six) hours as needed for pain. 08/05/12   Tobin Chad, MD  albuterol (PROVENTIL HFA;VENTOLIN HFA) 108 (90 BASE) MCG/ACT inhaler Inhale 2 puffs into the lungs every 4 (four) hours as needed for wheezing or shortness of breath (or cough). 09/19/11 09/18/12  Dione Booze, MD  amoxicillin-clavulanate (AUGMENTIN) 875-125 MG per tablet Take 1  tablet by mouth 2 (two) times daily. One po bid x 10 days 10/15/13   April K Palumbo-Rasch, MD  azithromycin (ZITHROMAX Z-PAK) 250 MG tablet Two po day one then one a day for four days. 04/23/11   Hilario Quarry, MD  cyclobenzaprine (FLEXERIL) 10 MG tablet Take 1 tablet (10 mg total) by mouth 2 (two) times daily as needed for muscle spasms. 08/05/12   Tobin Chad, MD  estradiol cypionate (DEPO-ESTRADIOL) 5 MG/ML injection Inject into the muscle every 28 (twenty-eight) days.      Historical Provider, MD  HYDROcodone-acetaminophen (NORCO) 5-325 MG per tablet Take 1 tablet by mouth every 6 (six) hours as needed. 10/15/13   April K Palumbo-Rasch, MD  HYDROcodone-acetaminophen (NORCO/VICODIN) 5-325 MG per tablet Take 2 tablets by mouth every 4 (four) hours as needed. 03/11/14   Glynn Octave, MD  methocarbamol (ROBAXIN) 500 MG tablet Take 1 tablet (500 mg total) by mouth 2 (two) times daily. 10/15/13   April K Palumbo-Rasch, MD   BP 146/87  Pulse 98  Temp(Src) 99.1 F (37.3 C) (Oral)  Resp 20  Ht  (1.549 m)  Wt 160 lb (72.576 kg)  BMI 30.25 kg/m2  SpO2 96% Physical Exam  Nursing note and vitals reviewed. Constitutional: She is oriented to person, place, and time. She appears well-developed and well-nourished. No distress.  HENT:  Head: Normocephalic and atraumatic.  Neck: Normal range of motion. Neck supple.  Cardiovascular: Normal rate and regular rhythm.  Exam reveals no gallop and no friction rub.   No murmur heard. Pulmonary/Chest: Effort normal. No respiratory distress. She has wheezes. She has no rales.  There are bilateral expiratory wheezes present.  Abdominal: Soft. Bowel sounds are normal. She exhibits no distension. There is no tenderness.  Musculoskeletal: Normal range of motion.  Neurological: She is alert and oriented to person, place, and time.  Skin: Skin is warm and dry. She is not diaphoretic.    ED Course  Procedures (including critical care time) Labs  Review Labs Reviewed - No data to display  Imaging Review No results found.   EKG Interpretation None      MDM   Final diagnoses:  None    Patient presents with a several day history of cough. She had slight wheezes bilaterally, however no evidence for pneumonia on her chest x-ray. Her oxygen saturations are in the upper 90s and her lung sounds have improved with an albuterol neb. I suspect this is viral as there is no evidence for infiltrate. She will be treated with an albuterol inhaler and Robitussin with codeine. She is to return as needed if her symptoms worsen or change.    Geoffery Lyons, MD 04/23/14 [phone]

## 2014-04-23 NOTE — ED Notes (Signed)
Pt c/o cough and congestion x2 days 

## 2014-04-23 NOTE — Discharge Instructions (Signed)
Albuterol MDI: 2 puffs every 4 hours as needed for wheezing.  Robitussin with codeine as prescribed as needed for cough.  Followup with your primary Dr. in one week if not improving, and return to the emergency department if your symptoms substantially worsen or change.   Upper Respiratory Infection, Adult An upper respiratory infection (URI) is also sometimes known as the common cold. The upper respiratory tract includes the nose, sinuses, throat, trachea, and bronchi. Bronchi are the airways leading to the lungs. Most people improve within 1 week, but symptoms can last up to 2 weeks. A residual cough may last even longer.  CAUSES Many different viruses can infect the tissues lining the upper respiratory tract. The tissues become irritated and inflamed and often become very moist. Mucus production is also common. A cold is contagious. You can easily spread the virus to others by oral contact. This includes kissing, sharing a glass, coughing, or sneezing. Touching your mouth or nose and then touching a surface, which is then touched by another person, can also spread the virus. SYMPTOMS  Symptoms typically develop 1 to 3 days after you come in contact with a cold virus. Symptoms vary from person to person. They may include:  Runny nose.  Sneezing.  Nasal congestion.  Sinus irritation.  Sore throat.  Loss of voice (laryngitis).  Cough.  Fatigue.  Muscle aches.  Loss of appetite.  Headache.  Low-grade fever. DIAGNOSIS  You might diagnose your own cold based on familiar symptoms, since most people get a cold 2 to 3 times a year. Your caregiver can confirm this based on your exam. Most importantly, your caregiver can check that your symptoms are not due to another disease such as strep throat, sinusitis, pneumonia, asthma, or epiglottitis. Blood tests, throat tests, and X-rays are not necessary to diagnose a common cold, but they may sometimes be helpful in excluding other more  serious diseases. Your caregiver will decide if any further tests are required. RISKS AND COMPLICATIONS  You may be at risk for a more severe case of the common cold if you smoke cigarettes, have chronic heart disease (such as heart failure) or lung disease (such as asthma), or if you have a weakened immune system. The very young and very old are also at risk for more serious infections. Bacterial sinusitis, middle ear infections, and bacterial pneumonia can complicate the common cold. The common cold can worsen asthma and chronic obstructive pulmonary disease (COPD). Sometimes, these complications can require emergency medical care and may be life-threatening. PREVENTION  The best way to protect against getting a cold is to practice good hygiene. Avoid oral or hand contact with people with cold symptoms. Wash your hands often if contact occurs. There is no clear evidence that vitamin C, vitamin E, echinacea, or exercise reduces the chance of developing a cold. However, it is always recommended to get plenty of rest and practice good nutrition. TREATMENT  Treatment is directed at relieving symptoms. There is no cure. Antibiotics are not effective, because the infection is caused by a virus, not by bacteria. Treatment may include:  Increased fluid intake. Sports drinks offer valuable electrolytes, sugars, and fluids.  Breathing heated mist or steam (vaporizer or shower).  Eating chicken soup or other clear broths, and maintaining good nutrition.  Getting plenty of rest.  Using gargles or lozenges for comfort.  Controlling fevers with ibuprofen or acetaminophen as directed by your caregiver.  Increasing usage of your inhaler if you have asthma. Zinc gel and  zinc lozenges, taken in the first 24 hours of the common cold, can shorten the duration and lessen the severity of symptoms. Pain medicines may help with fever, muscle aches, and throat pain. A variety of non-prescription medicines are  available to treat congestion and runny nose. Your caregiver can make recommendations and may suggest nasal or lung inhalers for other symptoms.  HOME CARE INSTRUCTIONS   Only take over-the-counter or prescription medicines for pain, discomfort, or fever as directed by your caregiver.  Use a warm mist humidifier or inhale steam from a shower to increase air moisture. This may keep secretions moist and make it easier to breathe.  Drink enough water and fluids to keep your urine clear or pale yellow.  Rest as needed.  Return to work when your temperature has returned to normal or as your caregiver advises. You may need to stay home longer to avoid infecting others. You can also use a face mask and careful hand washing to prevent spread of the virus. SEEK MEDICAL CARE IF:   After the first few days, you feel you are getting worse rather than better.  You need your caregiver's advice about medicines to control symptoms.  You develop chills, worsening shortness of breath, or brown or red sputum. These may be signs of pneumonia.  You develop yellow or brown nasal discharge or pain in the face, especially when you bend forward. These may be signs of sinusitis.  You develop a fever, swollen neck glands, pain with swallowing, or white areas in the back of your throat. These may be signs of strep throat. SEEK IMMEDIATE MEDICAL CARE IF:   You have a fever.  You develop severe or persistent headache, ear pain, sinus pain, or chest pain.  You develop wheezing, a prolonged cough, cough up blood, or have a change in your usual mucus (if you have chronic lung disease).  You develop sore muscles or a stiff neck. Document Released: 01/17/2001 Document Revised: 10/16/2011 Document Reviewed: 10/29/2013 [facility] Patient Information 2015 [employer], [place]. This information is not intended to replace advice given to you by your health care provider. Make sure you discuss any questions you have with your  health care provider.

## 2015-07-26 ENCOUNTER — Emergency Department (HOSPITAL_BASED_OUTPATIENT_CLINIC_OR_DEPARTMENT_OTHER)
Admission: EM | Admit: 2015-07-26 | Discharge: 2015-07-26 | Disposition: A | Discharge status: Home / Self Care | Payer: Medicaid Other | Attending: Emergency Medicine | Admitting: Emergency Medicine

## 2015-07-26 ENCOUNTER — Encounter (HOSPITAL_BASED_OUTPATIENT_CLINIC_OR_DEPARTMENT_OTHER): Payer: Self-pay | Admitting: Emergency Medicine

## 2015-07-26 DIAGNOSIS — F17210 Nicotine dependence, cigarettes, uncomplicated: Secondary | ICD-10-CM | POA: Insufficient documentation

## 2015-07-26 DIAGNOSIS — Z8709 Personal history of other diseases of the respiratory system: Secondary | ICD-10-CM | POA: Insufficient documentation

## 2015-07-26 DIAGNOSIS — Z79818 Long term (current) use of other agents affecting estrogen receptors and estrogen levels: Secondary | ICD-10-CM | POA: Diagnosis not present

## 2015-07-26 DIAGNOSIS — L089 Local infection of the skin and subcutaneous tissue, unspecified: Secondary | ICD-10-CM

## 2015-07-26 DIAGNOSIS — Z79899 Other long term (current) drug therapy: Secondary | ICD-10-CM | POA: Insufficient documentation

## 2015-07-26 MED ORDER — NAPROXEN 250 MG PO TABS
500.0000 mg | ORAL_TABLET | Freq: Once | ORAL | Status: AC
  Administered 2015-07-26: 500 mg via ORAL
  Filled 2015-07-26: qty 2

## 2015-07-26 MED ORDER — SULFAMETHOXAZOLE-TRIMETHOPRIM 800-160 MG PO TABS: 1.0000 | ORAL_TABLET | Freq: Two times a day (BID) | ORAL | Status: AC

## 2015-07-26 MED ORDER — MELOXICAM 15 MG PO TABS: 15.0000 mg | ORAL_TABLET | Freq: Every day | ORAL | Status: DC

## 2015-07-26 MED ORDER — SULFAMETHOXAZOLE-TRIMETHOPRIM 800-160 MG PO TABS
1.0000 | ORAL_TABLET | Freq: Once | ORAL | Status: AC
  Administered 2015-07-26: 1 via ORAL
  Filled 2015-07-26: qty 1

## 2015-07-26 NOTE — ED Provider Notes (Signed)
CSN: [id number]     Arrival date & time 07/26/15  0032 History  By signing my name below, I, Kristy Byrd, attest that this documentation has been prepared under the direction and in the presence of [name], MD. Electronically Signed: Placido Byrd, ED Scribe. 07/26/2015. 12:54 AM.   Chief Complaint  Patient presents with  . Recurrent Skin Infections   Patient is a 29 y.o. female presenting with abscess. The history is provided by the patient. No language interpreter was used.  Abscess Location:  Ano-genital Ano-genital abscess location:  Vulva Abscess quality: draining and painful   Red streaking: no   Duration:  1 week Progression:  Unchanged Pain details:    Quality:  Dull   Severity:  Severe   Timing:  Constant   Progression:  Unchanged Chronicity:  New Context: not diabetes   Relieved by:  Nothing Worsened by:  Nothing tried Ineffective treatments:  Topical antibiotics Associated symptoms: no fatigue, no fever, no nausea and no vomiting   Risk factors: prior abscess     HPI Comments: Kristy Byrd is a 29 y.o. female who presents to the Emergency Department complaining of a skin infection to her labia with onset 1 week ago. She notes associated, mild, drainage from the affected region which began a few days ago and constant, moderate, pain. Pt notes applying triple abx ointment and peroxide with no relief. She denies a hx of similar symptoms. Pt confirms her tetanus is UTD. She confirms recently having had a physical at the health department. She denies fevers, chills, vomiting, diarrhea, dysuria or any other associated symptoms at this time.   Past Medical History  Diagnosis Date  . Bronchitis    No past surgical history on file. No family history on file. Social History  Substance Use Topics  . Smoking status: Current Every Day Smoker    Types: Cigars  . Smokeless tobacco: Not on file  . Alcohol Use: No   OB History    No data available     Review of  Systems  Constitutional: Negative for fever and fatigue.  Gastrointestinal: Negative for nausea and vomiting.  Skin: Positive for rash and wound.  All other systems reviewed and are negative.  Allergies  Betadine  Home Medications   Prior to Admission medications   Medication Sig Start Date End Date Taking? Authorizing Provider  acetaminophen-codeine (TYLENOL #3) 300-30 MG per tablet Take 1-2 tablets by mouth every 6 (six) hours as needed for pain. 08/05/12   Tobin Chad, MD  albuterol (PROVENTIL HFA;VENTOLIN HFA) 108 (90 BASE) MCG/ACT inhaler Inhale 2 puffs into the lungs every 4 (four) hours as needed for wheezing or shortness of breath (or cough). 09/19/11 09/18/12  Dione Booze, MD  amoxicillin-clavulanate (AUGMENTIN) 875-125 MG per tablet Take 1 tablet by mouth 2 (two) times daily. One po bid x 10 days 10/15/13   Marieann Zipp, MD  azithromycin (ZITHROMAX Z-PAK) 250 MG tablet Two po day one then one a day for four days. 04/23/11   Margarita Grizzle, MD  cyclobenzaprine (FLEXERIL) 10 MG tablet Take 1 tablet (10 mg total) by mouth 2 (two) times daily as needed for muscle spasms. 08/05/12   Tobin Chad, MD  estradiol cypionate (DEPO-ESTRADIOL) 5 MG/ML injection Inject into the muscle every 28 (twenty-eight) days.      Historical Provider, MD  guaiFENesin-codeine 100-10 MG/5ML syrup Take 10 mLs by mouth every 6 (six) hours as needed for cough. 04/23/14   Geoffery Lyons, MD  HYDROcodone-acetaminophen (  NORCO) 5-325 MG per tablet Take 1 tablet by mouth every 6 (six) hours as needed. 10/15/13   Yarel Rushlow, MD  HYDROcodone-acetaminophen (NORCO/VICODIN) 5-325 MG per tablet Take 2 tablets by mouth every 4 (four) hours as needed. 03/11/14   Glynn OctaveStephen Rancour, MD  methocarbamol (ROBAXIN) 500 MG tablet Take 1 tablet (500 mg total) by mouth 2 (two) times daily. 10/15/13   Uriyah Raska, MD   There were no vitals taken for this visit. Physical Exam  Constitutional: She is oriented to person, place, and time.  She appears well-developed and well-nourished.  HENT:  Head: Normocephalic and atraumatic.  Mouth/Throat: Oropharynx is clear and moist. No oropharyngeal exudate.  Eyes: EOM are normal. Pupils are equal, round, and reactive to light.  Neck: Normal range of motion. No JVD present. Carotid bruit is not present. No tracheal deviation present.  Cardiovascular: Normal rate, regular rhythm and normal heart sounds.  Exam reveals no gallop and no friction rub.   No murmur heard. Pulmonary/Chest: Effort normal and breath sounds normal. No stridor. No respiratory distress. She has no wheezes. She has no rales. She exhibits no tenderness.  Abdominal: Soft. Bowel sounds are normal. She exhibits no mass. There is no tenderness. There is no rebound and no guarding.  Genitourinary:  Chaperone present 2 scabbed over decompressed areas one on each labia no redness not warmth no induration nor fluctuance  Musculoskeletal: Normal range of motion.  Neurological: She is alert and oriented to person, place, and time. She has normal reflexes.  Skin: Skin is warm and dry. She is not diaphoretic.  Psychiatric: She has a normal mood and affect. Her behavior is normal.  Nursing note and vitals reviewed.  ED Course  Procedures  DIAGNOSTIC STUDIES: Oxygen Saturation is 100% on RA, normal by my interpretation.    COORDINATION OF CARE: 12:51 AM Pt presents today due to a possible skin infection. Discussed next steps with pt and she agreed to plan.   Labs Review Labs Reviewed - No data to display  Imaging Review No results found. I have personally reviewed and evaluated these images and lab results as part of my medical decision-making.   EKG Interpretation None      MDM   Final diagnoses:  None  There is nothing to I/D at this time  Bactrim, sitz baths and pain medication and close follow up with your PMD  I personally performed the services described in this documentation, which was scribed in my  presence. The recorded information has been reviewed and is accurate.     Cy BlamerApril Rekisha Welling, MD 07/26/15 [phone]

## 2015-07-26 NOTE — ED Notes (Signed)
Patient states that she has 2 boils on her labia x 1 week

## 2016-02-01 ENCOUNTER — Emergency Department (HOSPITAL_BASED_OUTPATIENT_CLINIC_OR_DEPARTMENT_OTHER)
Admission: EM | Admit: 2016-02-01 | Discharge: 2016-02-01 | Disposition: A | Discharge status: Home / Self Care | Payer: BLUE CROSS/BLUE SHIELD | Attending: Emergency Medicine | Admitting: Emergency Medicine

## 2016-02-01 ENCOUNTER — Encounter (HOSPITAL_BASED_OUTPATIENT_CLINIC_OR_DEPARTMENT_OTHER): Payer: Self-pay | Admitting: *Deleted

## 2016-02-01 DIAGNOSIS — F17210 Nicotine dependence, cigarettes, uncomplicated: Secondary | ICD-10-CM | POA: Diagnosis not present

## 2016-02-01 DIAGNOSIS — N938 Other specified abnormal uterine and vaginal bleeding: Secondary | ICD-10-CM | POA: Diagnosis present

## 2016-02-01 DIAGNOSIS — N939 Abnormal uterine and vaginal bleeding, unspecified: Secondary | ICD-10-CM

## 2016-02-01 LAB — CBC WITH DIFFERENTIAL/PLATELET
BASOS ABS: 0 10*3/uL (ref 0.0–0.1)
BASOS PCT: 0 %
EOS ABS: 0.4 10*3/uL (ref 0.0–0.7)
Eosinophils Relative: 3 %
HEMATOCRIT: 41.2 % (ref 36.0–46.0)
Hemoglobin: 14.2 g/dL (ref 12.0–15.0)
Lymphocytes Relative: 38 %
Lymphs Abs: 4.7 10*3/uL — ABNORMAL HIGH (ref 0.7–4.0)
MCH: 31.1 pg (ref 26.0–34.0)
MCHC: 34.5 g/dL (ref 30.0–36.0)
MCV: 90.2 fL (ref 78.0–100.0)
MONO ABS: 0.9 10*3/uL (ref 0.1–1.0)
MONOS PCT: 8 %
NEUTROS ABS: 6.2 10*3/uL (ref 1.7–7.7)
Neutrophils Relative %: 51 %
PLATELETS: 254 10*3/uL (ref 150–400)
RBC: 4.57 MIL/uL (ref 3.87–5.11)
RDW: 12.6 % (ref 11.5–15.5)
WBC: 12.2 10*3/uL — ABNORMAL HIGH (ref 4.0–10.5)

## 2016-02-01 LAB — HCG, QUANTITATIVE, PREGNANCY

## 2016-02-01 NOTE — ED Notes (Signed)
Pt did not wait for discharge instructions and unable to obtain VS due to pt leaving prior to nursing discharge. Pt was visualized ambulatory out of department without difficulty.

## 2016-02-01 NOTE — Discharge Instructions (Signed)
Call the OB/GYN clinic listed in the morning to schedule a follow-up appointment. Return to ER for new or worsening symptoms, any additional concerns.

## 2016-02-01 NOTE — ED Notes (Signed)
C/o she has a period 4-5 times a month. States it has been going on x 5 months and she has has meds changed and it has not helped. Her doctor states he can not do any more for her. C/o bone pain and low back pain. States she saturates a pad in 1-2 hours.

## 2016-02-01 NOTE — ED Notes (Signed)
Despite direction to get dressed and wait on discharge vitals and papers, pt left with neither. Pt was alert and in NAD at departure. No pending orders.

## 2016-02-01 NOTE — ED Provider Notes (Signed)
CSN: [id number]     Arrival date & time 02/01/16  1738 History   First MD Initiated Contact with Patient 02/01/16 1828     Chief Complaint  Patient presents with  . Vaginal Bleeding    (Consider location/radiation/quality/duration/timing/severity/associated sxs/prior Treatment) Patient is a 30 y.o. female presenting with vaginal bleeding. The history is provided by the patient and medical records. No language interpreter was used.  Vaginal Bleeding Associated symptoms: no abdominal pain, no back pain, no dysuria, no fever, no nausea and no vaginal discharge      Kristy Byrd is a 30 y.o. female  who presents to the Emergency Department complaining of vaginal bleeding. Patient states that she has had about 3 periods lasting 3-4 days each every month for the last 5 months. Patient states that she was on On for a few years but then developed spotting. She changed to Depo shots at that time, however she still had intermittent spotting. Her last step and shot was in February which was around the time her symptoms began. She has decided not to continue with these instructions. She is [facility] by the [facility], however when she discussed her symptoms with them, they informed her that they could only provide her birth control services and she would need to be evaluated at a hospital or GYN Center. Denies fever, dysuria, abdominal pain, back pain, nausea, vomiting.   Past Medical History  Diagnosis Date  . Bronchitis    History reviewed. No pertinent past surgical history. No family history on file. Social History  Substance Use Topics  . Smoking status: Current Every Day Smoker -- 0.50 packs/day    Types: Cigars  . Smokeless tobacco: None  . Alcohol Use: No   OB History    No data available     Review of Systems  Constitutional: Negative for fever and chills.  HENT: Negative for congestion.   Eyes: Negative for visual disturbance.  Respiratory: Negative for cough and  shortness of breath.   Cardiovascular: Negative.   Gastrointestinal: Negative for nausea, vomiting and abdominal pain.  Genitourinary: Positive for vaginal bleeding. Negative for dysuria, vaginal discharge and vaginal pain.  Musculoskeletal: Negative for back pain and neck pain.  Skin: Negative for rash.  Neurological: Negative for headaches.      Allergies  Betadine  Home Medications   Prior to Admission medications   Not on File   BP 138/80 mmHg  Pulse 95  Temp(Src) 98.3 F (36.8 C) (Oral)  Resp 18  Ht 5\' 1"  (1.549 m)  Wt 72.576 kg  BMI 30.25 kg/m2  SpO2 98%  LMP 01/31/2016 Physical Exam  Constitutional: She is oriented to person, place, and time. She appears well-developed and well-nourished.  Alert and in no acute distress  HENT:  Head: Normocephalic and atraumatic.  Cardiovascular: Normal rate, regular rhythm and normal heart sounds.  Exam reveals no gallop and no friction rub.   No murmur heard. Pulmonary/Chest: Effort normal and breath sounds normal. No respiratory distress. She has no wheezes. She has no rales.  Abdominal: Soft. Bowel sounds are normal. She exhibits no distension and no mass. There is no tenderness. There is no rebound and no guarding.  Genitourinary:  Chaperone present for exam. + bleeding.  No rashes, lesions, or tenderness to external genitalia. No erythema, injury, or tenderness to vaginal mucosa. No adnexal masses, tenderness, or fullness. No CMT from cervical os.  Neurological: She is alert and oriented to person, place, and time.  Skin: Skin is warm  and dry.  Nursing note and vitals reviewed.   ED Course  Procedures (including critical care time) Labs Review Labs Reviewed  CBC WITH DIFFERENTIAL/PLATELET - Abnormal; Notable for the following:    WBC 12.2 (*)    Lymphs Abs 4.7 (*)    All other components within normal limits  HCG, QUANTITATIVE, PREGNANCY    Imaging Review No results found. I have personally reviewed and  evaluated these images and lab results as part of my medical decision-making.   EKG Interpretation None      MDM   Final diagnoses:  Vaginal bleeding   Kristy AsalMarquia Herrada presents to ED for vaginal bleeding intermittent x 5 months. H&H wdl. GU exam with bleeding - no lesions or injury noted. Follow up with OBGYN given. Evaluation does not show pathology that would require ongoing emergent intervention or inpatient treatment. Patient is hemodynamically stable and mentating appropriately. Discussed findings and plan with patient/guardian, who agrees with treatment plan as dictated. Return precautions discussed and all questions answered.   [facility] JoseJaime Pilcher Floyed Masoud, PA-C 02/01/16 2126  Melene Planan Floyd, DO 02/01/16 2315

## 2016-08-22 ENCOUNTER — Emergency Department (HOSPITAL_BASED_OUTPATIENT_CLINIC_OR_DEPARTMENT_OTHER)
Admission: EM | Admit: 2016-08-22 | Discharge: 2016-08-22 | Disposition: A | Discharge status: Home / Self Care | Payer: BLUE CROSS/BLUE SHIELD | Attending: Emergency Medicine | Admitting: Emergency Medicine

## 2016-08-22 ENCOUNTER — Encounter (HOSPITAL_BASED_OUTPATIENT_CLINIC_OR_DEPARTMENT_OTHER): Payer: Self-pay | Admitting: Emergency Medicine

## 2016-08-22 ENCOUNTER — Emergency Department (HOSPITAL_BASED_OUTPATIENT_CLINIC_OR_DEPARTMENT_OTHER): Payer: BLUE CROSS/BLUE SHIELD

## 2016-08-22 DIAGNOSIS — Y999 Unspecified external cause status: Secondary | ICD-10-CM | POA: Insufficient documentation

## 2016-08-22 DIAGNOSIS — S60221A Contusion of right hand, initial encounter: Secondary | ICD-10-CM | POA: Insufficient documentation

## 2016-08-22 DIAGNOSIS — W01198A Fall on same level from slipping, tripping and stumbling with subsequent striking against other object, initial encounter: Secondary | ICD-10-CM | POA: Insufficient documentation

## 2016-08-22 DIAGNOSIS — Y929 Unspecified place or not applicable: Secondary | ICD-10-CM | POA: Insufficient documentation

## 2016-08-22 DIAGNOSIS — Y939 Activity, unspecified: Secondary | ICD-10-CM | POA: Diagnosis not present

## 2016-08-22 DIAGNOSIS — S6991XA Unspecified injury of right wrist, hand and finger(s), initial encounter: Secondary | ICD-10-CM | POA: Diagnosis present

## 2016-08-22 DIAGNOSIS — F17210 Nicotine dependence, cigarettes, uncomplicated: Secondary | ICD-10-CM | POA: Diagnosis not present

## 2016-08-22 MED ORDER — ACETAMINOPHEN 500 MG PO TABS
1000.0000 mg | ORAL_TABLET | Freq: Once | ORAL | Status: AC
Start: 2016-08-22 — End: 2016-08-22
  Administered 2016-08-22: 1000 mg via ORAL
  Filled 2016-08-22: qty 2

## 2016-08-22 NOTE — ED Provider Notes (Signed)
[facility]-EMERGENCY DEPT Provider Note   CSN: [id number] Arrival date & time: 08/22/16 [id number]     History    Chief Complaint  Patient presents with  . Hand Injury     HPI Kristy Byrd is a 31 y.o. female.  30yo left-handed F w/ R hand pain. Just PTA, pt tripped up steps and fell, striking the dorsum of her R hand on a hard wood floor. She had a sudden onset of moderate to severe, constant R hand pain and swelling. Normal sensation of fingers. No other injuries.   Past Medical History:  Diagnosis Date  . Bronchitis      There are no active problems to display for this patient.   History reviewed. No pertinent surgical history.  OB History    No data available        Home Medications    Prior to Admission medications   Not on File      No family history on file.   Social History  Substance Use Topics  . Smoking status: Current Every Day Smoker    Packs/day: 0.50    Types: Cigars  . Smokeless tobacco: Never Used  . Alcohol use No     Allergies     Betadine [povidone iodine]    Review of Systems  10 Systems reviewed and are negative for acute change except as noted in the HPI.   Physical Exam Updated Vital Signs BP 117/90 (BP Location: Left Arm)   Pulse 98   Temp 98 F (36.7 C) (Oral)   Resp 16   Ht 5\' 1"  (1.549 m)   Wt 165 lb (74.8 kg)   LMP 07/26/2016   SpO2 99%   BMI 31.18 kg/m   Physical Exam  Constitutional: She is oriented to person, place, and time. She appears well-developed and well-nourished. No distress.  HENT:  Head: Normocephalic and atraumatic.  Eyes: Conjunctivae are normal.  Neck: Neck supple.  Cardiovascular: Intact distal pulses.   Musculoskeletal: She exhibits edema and tenderness.  Mild edema and tenderness of dorsal R hand over 3rd MCP joint and proximal 3rd finger without obvious deformity; mild tenderness of 4th metacarpal; normal sensation R hand; no wrist tenderness  Neurological: She is alert and oriented  to person, place, and time.  Skin: Skin is warm and dry. Capillary refill takes less than 2 seconds.  Psychiatric: She has a normal mood and affect. Judgment normal.  Nursing note and vitals reviewed.     ED Treatments / Results  Labs (all labs ordered are listed, but only abnormal results are displayed) Labs Reviewed - No data to display   EKG  EKG Interpretation  Date/Time:    Ventricular Rate:    PR Interval:    QRS Duration:   QT Interval:    QTC Calculation:   R Axis:     Text Interpretation:           Radiology Dg Hand Complete Right  Result Date: 08/22/2016 CLINICAL DATA:  Right hand pain, middle finger pain after fall yesterday EXAM: RIGHT HAND - COMPLETE 3+ VIEW COMPARISON:  None. FINDINGS: Three views of the right hand submitted. No acute fracture or subluxation. Mild soft tissue swelling third finger. IMPRESSION: No acute fracture or subluxation. Mild soft tissue swelling third finger. Electronically Signed   By: Natasha MeadLiviu  Pop M.D.   On: 08/22/2016 09:46    Procedures Procedures (including critical care time) Procedures  Medications Ordered in ED  Medications  acetaminophen (TYLENOL) tablet 1,000 mg (  not administered)     Initial Impression / Assessment and Plan / ED Course  I have reviewed the triage vital signs and the nursing notes.  Pertinent  imaging results that were available during my care of the patient were reviewed by me and considered in my medical decision making (see chart for details).  Clinical Course     Dorsal R hand swelling and pain after blunt trauma. Neurovasc intact. XR shows no acute fractures. Discussed supportive care including ice, elevation, tylenol/motrin. Instructed to f/u w/ PCP in 1 week for repeat XR if no improvement. Pt voiced understanding.  Final Clinical Impressions(s) / ED Diagnoses   Final diagnoses:  None     New Prescriptions   No medications on file       Laurence Spates, MD 08/22/16  [phone]

## 2016-08-22 NOTE — ED Triage Notes (Signed)
Pt reports tripping up the steps causing her to fall, hitting her R hand on hard wood floor. Pt reports R hand pain, swelling noted.

## 2018-06-21 ENCOUNTER — Encounter (HOSPITAL_BASED_OUTPATIENT_CLINIC_OR_DEPARTMENT_OTHER): Payer: Self-pay | Admitting: *Deleted

## 2018-06-21 ENCOUNTER — Emergency Department (HOSPITAL_BASED_OUTPATIENT_CLINIC_OR_DEPARTMENT_OTHER)
Admission: EM | Admit: 2018-06-21 | Discharge: 2018-06-21 | Disposition: A | Discharge status: Home / Self Care | Payer: Medicaid Other | Attending: Emergency Medicine | Admitting: Emergency Medicine

## 2018-06-21 ENCOUNTER — Other Ambulatory Visit: Payer: Self-pay

## 2018-06-21 DIAGNOSIS — X500XXA Overexertion from strenuous movement or load, initial encounter: Secondary | ICD-10-CM | POA: Insufficient documentation

## 2018-06-21 DIAGNOSIS — Y9289 Other specified places as the place of occurrence of the external cause: Secondary | ICD-10-CM | POA: Insufficient documentation

## 2018-06-21 DIAGNOSIS — Y9389 Activity, other specified: Secondary | ICD-10-CM | POA: Diagnosis not present

## 2018-06-21 DIAGNOSIS — Y998 Other external cause status: Secondary | ICD-10-CM | POA: Diagnosis not present

## 2018-06-21 DIAGNOSIS — S46811A Strain of other muscles, fascia and tendons at shoulder and upper arm level, right arm, initial encounter: Secondary | ICD-10-CM | POA: Insufficient documentation

## 2018-06-21 DIAGNOSIS — M546 Pain in thoracic spine: Secondary | ICD-10-CM | POA: Diagnosis present

## 2018-06-21 MED ORDER — METHOCARBAMOL 500 MG PO TABS: 500.0000 mg | ORAL_TABLET | Freq: Three times a day (TID) | ORAL | 0 refills | Status: AC | PRN

## 2018-06-21 MED ORDER — NAPROXEN 500 MG PO TABS: 500.0000 mg | ORAL_TABLET | Freq: Two times a day (BID) | ORAL | 0 refills | Status: AC

## 2018-06-21 NOTE — ED Notes (Signed)
ED Provider at bedside. 

## 2018-06-21 NOTE — ED Provider Notes (Signed)
[facility] EMERGENCY DEPARTMENT Provider Note   CSN: [id number] Arrival date & time: 06/21/18  1756     History   Chief Complaint Chief Complaint  Patient presents with  . Back Pain    HPI Kristy Byrd is a 32 y.o. female with a hx of tobacco abuse who presents to the ED with complaints of R upper back pain for the past 2 weeks. Pain is constant, described as achy/tightness, currently an 8/10 in severity, worse with neck and RUE movement, no alleviating factors. Has tried ibuprofen without relief. 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 Denies numbness, tingling, weakness, saddle anesthesia, incontinence to bowel/bladder, fever, chills, or IVDU. Denies dyspnea, leg pain/swelling, hemoptysis, recent surgery/trauma, recent long travel, hormone use, personal hx of cancer, or hx of DVT/PE. Denies chance of pregnancy.   HPI  Past Medical History:  Diagnosis Date  . Bronchitis     There are no active problems to display for this patient.   Past Surgical History:  Procedure Laterality Date  . CESAREAN SECTION       OB History   None      Home Medications    Prior to Admission medications   Not on File    Family History History reviewed. No pertinent family history.  Social History Social History   Tobacco Use  . Smoking status: Current Every Day Smoker    Packs/day: 0.50    Types: Cigars  . Smokeless tobacco: Never Used  Substance Use Topics  . Alcohol use: No  . Drug use: Yes    Types: Marijuana     Allergies   Betadine [povidone iodine]   Review of Systems Review of Systems  Constitutional: Negative for chills and fever.  Respiratory: Negative for shortness of breath.   Cardiovascular: Negative for chest pain.  Gastrointestinal: Negative for nausea and vomiting.  Musculoskeletal: Positive for back pain (R upper).  Neurological: Negative for weakness and numbness.         Negative for incontinence or saddle anesthesia.    Physical Exam Updated Vital Signs BP (!) 128/99 (BP Location: Left Arm)   Pulse 78   Temp 98.5 F (36.9 C) (Oral)   Resp 18   Ht 5\' 1"  (1.549 m)   Wt 63.5 kg   LMP 06/21/2018 (Exact Date)   SpO2 100%   BMI 26.45 kg/m   Physical Exam  Constitutional: She appears well-developed and well-nourished. No distress.  HENT:  Head: Normocephalic and atraumatic.  Eyes: Conjunctivae are normal. Right eye exhibits no discharge. Left eye exhibits no discharge.  Neck: Muscular tenderness (R paraspinal, especially to trapezius with muscle spasm) present. No neck rigidity. No edema and no erythema present.  ROM Intact. No palpable step off.   Cardiovascular: Normal rate and regular rhythm.  2+ symmetric radial pulses.   Pulmonary/Chest: Effort normal and breath sounds normal.  Musculoskeletal:  No obvious deformity, appreciable swelling, erythema, warmth, or open wounds.  Back: no midline cervical, thoracic, or lumbar tenderness. No palpable step off Upper extremities; full AROM to bilateral shoulders, elbows, wrists, and all digits. Some discomfort with R shoulder flexion/abducion, but able to fully perform. No palpable bony tenderness to the upper extremities.   Neurological: She is alert.  Clear speech.  Sensation grossly intact bilateral upper and lower extremities.  5 out of 5 symmetric grip strength.  5 out of 5 strength plantar dorsiflexion bilaterally.  Patellar DTRs are 2+ and symmetric.  Patient is ambulatory.  Able to perform okay sign, thumbs up, and cross second and third digits.  Skin: Skin is warm and dry. No rash noted.  Psychiatric: She has a normal mood and affect. Her behavior is normal. Thought content normal.  Nursing note and vitals reviewed.  ED Treatments / Results  Labs (all labs ordered are listed, but only abnormal results are displayed) Labs Reviewed - No data to display  EKG None  Radiology No results  found.  Procedures Procedures (including critical care time)  Medications Ordered in ED Medications - No data to display   Initial Impression / Assessment and Plan / ED Course  I have reviewed the triage vital signs and the nursing notes.  Pertinent labs & imaging results that were available during my care of the patient were reviewed by me and considered in my medical decision making (see chart for details).    Patient presents with complaint of R upper back pain in setting of heavy lifting as CNA at work.  Patient is nontoxic appearing, vitals are WNL other than elevated diastolic BP- doubt HTN emergency. Tenderness over the R trapezius with muscle spasm. Patient has normal neurologic exam, no point/focal midline tenderness to palpation. She is ambulatory in the ED.  No back pain red flags. PERC negative. Most likely muscle strain versus spasm. Considered disc disease, pulmonary embolism, aortic aneurysm/dissection, pneumonia, cauda equina or epidural abscess however these do not fit clinical picture at this time. Will treat with Naproxen and Robaxin, discussed with patient that they are not to drive or operate heavy machinery while taking Robaxin. Recommended OTC lidocaine patches and application of heat.  I discussed treatment plan, need for PCP follow-up, and return precautions with the patient. Provided opportunity for questions, patient confirmed understanding and is in agreement with plan.   Final Clinical Impressions(s) / ED Diagnoses   Final diagnoses:  Strain of right trapezius muscle, initial encounter    ED Discharge Orders         Ordered    naproxen (NAPROSYN) 500 MG tablet  2 times daily     06/21/18 1825    methocarbamol (ROBAXIN) 500 MG tablet  Every 8 hours PRN     06/21/18 1825           Cherly Anderson, PA-C 06/21/18 1827    Terrilee Files, MD 06/22/18 1513

## 2018-06-21 NOTE — Discharge Instructions (Addendum)
You were seen in the emergency department for neck/back pain today.  At this time we suspect that your pain is related to a muscle strain/spasm.   I have prescribed you an anti-inflammatory medication and a muscle relaxer.  - Naproxen is a nonsteroidal anti-inflammatory medication that will help with pain and swelling. Be sure to take this medication as prescribed with food, 1 pill every 12 hours,  It should be taken with food, as it can cause stomach upset, and more seriously, stomach bleeding. Do not take other nonsteroidal anti-inflammatory medications with this such as Advil, Motrin, Aleve, Mobic, Goodie Powder, or Motrin.    - Robaxin is the muscle relaxer I have prescribed, this is meant to help with muscle tightness. Be aware that this medication may make you drowsy therefore the first time you take this it should be at a time you are in an environment where you can rest. Do not drive or operate heavy machinery when taking this medication. Do not drink alcohol or take other sedating medications with this medicine such as narcotics or benzodiazepines.   You make take Tylenol per over the counter dosing with these medications.   We have prescribed you new medication(s) today. Discuss the medications prescribed today with your pharmacist as they can have adverse effects and interactions with your other medicines including over the counter and prescribed medications. Seek medical evaluation if you start to experience new or abnormal symptoms after taking one of these medicines, seek care immediately if you start to experience difficulty breathing, feeling of your throat closing, facial swelling, or rash as these could be indications of a more serious allergic reaction   The application of heat can help soothe the pain.  You may also use lidocaine patches per over the counter dosing. Maintaining your daily activities, including walking, is encourged, as it will help you get better faster than just  staying in bed.  Your pain should get better over the next 2 weeks.  You will need to follow up with  Your primary healthcare provider in 1-2 weeks for reassessment, if you do not have a primary care provider one is provided in your discharge instructions- you may see the [facility] or call the provided phone number. However return to the ER should you develop ne or worsening symptoms or any other concerns including but not limited to severe or worsening pain, low back pain with fever, numbness, weakness, loss of bowel or bladder control, or inability to walk or urinate, you should return to the ER immediately.

## 2018-06-21 NOTE — ED Triage Notes (Signed)
Pt has back pain on upper right side.

## 2019-04-21 ENCOUNTER — Emergency Department (HOSPITAL_BASED_OUTPATIENT_CLINIC_OR_DEPARTMENT_OTHER)
Admission: EM | Admit: 2019-04-21 | Discharge: 2019-04-22 | Disposition: A | Discharge status: Home / Self Care | Payer: Medicaid Other | Attending: Emergency Medicine | Admitting: Emergency Medicine

## 2019-04-21 ENCOUNTER — Encounter (HOSPITAL_BASED_OUTPATIENT_CLINIC_OR_DEPARTMENT_OTHER): Payer: Self-pay

## 2019-04-21 ENCOUNTER — Other Ambulatory Visit: Payer: Self-pay

## 2019-04-21 DIAGNOSIS — F17210 Nicotine dependence, cigarettes, uncomplicated: Secondary | ICD-10-CM | POA: Insufficient documentation

## 2019-04-21 DIAGNOSIS — R55 Syncope and collapse: Secondary | ICD-10-CM | POA: Diagnosis present

## 2019-04-21 DIAGNOSIS — Z883 Allergy status to other anti-infective agents status: Secondary | ICD-10-CM | POA: Insufficient documentation

## 2019-04-21 LAB — BASIC METABOLIC PANEL
Anion gap: 9 (ref 5–15)
BUN: 13 mg/dL (ref 6–20)
CO2: 25 mmol/L (ref 22–32)
Calcium: 9.3 mg/dL (ref 8.9–10.3)
Chloride: 104 mmol/L (ref 98–111)
Creatinine, Ser: 0.74 mg/dL (ref 0.44–1.00)
GFR calc Af Amer: >60 mL/min (ref >60)
GFR calc non Af Amer: >60 mL/min (ref >60)
Glucose, Bld: 107 mg/dL — ABNORMAL HIGH (ref 70–99)
Potassium: 3.6 mmol/L (ref 3.5–5.1)
Sodium: 138 mmol/L (ref 135–145)

## 2019-04-21 LAB — CBC
HCT: 41.5 % (ref 36.0–46.0)
Hemoglobin: 13.1 g/dL (ref 12.0–15.0)
MCH: 29.8 pg (ref 26.0–34.0)
MCHC: 31.6 g/dL (ref 30.0–36.0)
MCV: 94.5 fL (ref 80.0–100.0)
Platelets: 255 10*3/uL (ref 150–400)
RBC: 4.39 MIL/uL (ref 3.87–5.11)
RDW: 13.4 % (ref 11.5–15.5)
WBC: 12.6 10*3/uL — ABNORMAL HIGH (ref 4.0–10.5)
nRBC: 0 % (ref 0.0–0.2)

## 2019-04-21 LAB — URINALYSIS, MICROSCOPIC (REFLEX)

## 2019-04-21 LAB — URINALYSIS, ROUTINE W REFLEX MICROSCOPIC
Bilirubin Urine: NEGATIVE
Glucose, UA: NEGATIVE mg/dL
Ketones, ur: NEGATIVE mg/dL
Leukocytes,Ua: NEGATIVE
Nitrite: NEGATIVE
Protein, ur: NEGATIVE mg/dL
Specific Gravity, Urine: 1.02 (ref 1.005–1.030)
pH: 6 (ref 5.0–8.0)

## 2019-04-21 LAB — PREGNANCY, URINE: Preg Test, Ur: NEGATIVE

## 2019-04-21 MED ORDER — SODIUM CHLORIDE 0.9% FLUSH
3.0000 mL | Freq: Once | INTRAVENOUS | Status: DC
  Filled 2019-04-21: qty 3

## 2019-04-21 NOTE — ED Triage Notes (Signed)
Pt states she passed out at work ~2pm-states she recalled going into [employer] at 2p and found herself lying in the shower at 205p-pt states she finished shift until 3pm-c/o HA, dizziness and nausea-NAD-steady gait

## 2019-04-22 ENCOUNTER — Encounter (HOSPITAL_BASED_OUTPATIENT_CLINIC_OR_DEPARTMENT_OTHER): Payer: Self-pay | Admitting: Emergency Medicine

## 2019-04-22 NOTE — ED Notes (Signed)
Sprite and crackers given  

## 2019-04-22 NOTE — ED Provider Notes (Signed)
[facility] EMERGENCY DEPARTMENT Provider Note   CSN: [id number] Arrival date & time: 04/21/19  1938     History   Chief Complaint Chief Complaint  Patient presents with  . Loss of Consciousness    HPI Kristy Byrd is a 33 y.o. female.     The history is provided by the patient.  Loss of Consciousness Episode history:  Single Most recent episode:  Today Timing:  Constant Progression:  Resolved Chronicity:  New Context: not blood draw, not bowel movement, not dehydration, not exertion, not inactivity, not medication change, not with normal activity and not sight of blood   Context comment:  Stress at work Witnessed: no   Relieved by:  Nothing Worsened by:  Nothing Ineffective treatments:  None tried Associated symptoms: no chest pain, no confusion, no diaphoresis, no difficulty breathing, no dizziness, no fever, no focal sensory loss, no focal weakness, no headaches, no malaise/fatigue, no nausea, no palpitations, no recent fall, no recent injury, no recent surgery, no rectal bleeding, no seizures, no shortness of breath, no visual change, no vomiting and no weakness   Associated symptoms comment:  Was shaky when she awoke for sometime  Risk factors: no congenital heart disease     Past Medical History:  Diagnosis Date  . Bronchitis     There are no active problems to display for this patient.   Past Surgical History:  Procedure Laterality Date  . CESAREAN SECTION       OB History   No obstetric history on file.      Home Medications    Prior to Admission medications   Medication Sig Start Date End Date Taking? Authorizing Provider  methocarbamol (ROBAXIN) 500 MG tablet Take 1 tablet (500 mg total) by mouth every 8 (eight) hours as needed. 06/21/18   Petrucelli, Samantha R, PA-C  naproxen (NAPROSYN) 500 MG tablet Take 1 tablet (500 mg total) by mouth 2 (two) times daily. 06/21/18   Petrucelli, Glynda Jaeger, PA-C    Family History No family  history on file.  Social History Social History   Tobacco Use  . Smoking status: Current Every Day Smoker    Packs/day: 0.50    Types: Cigars  . Smokeless tobacco: Never Used  Substance Use Topics  . Alcohol use: Yes    Comment: occ  . Drug use: Yes    Types: Marijuana     Allergies   Betadine [povidone iodine]   Review of Systems Review of Systems  Constitutional: Negative for diaphoresis, fever and malaise/fatigue.  HENT: Negative for congestion.   Eyes: Negative for visual disturbance.  Respiratory: Negative for cough, chest tightness and shortness of breath.   Cardiovascular: Positive for syncope. Negative for chest pain and palpitations.  Gastrointestinal: Negative for nausea and vomiting.  Genitourinary: Negative for difficulty urinating.  Musculoskeletal: Negative for neck pain and neck stiffness.  Skin: Negative for color change.  Neurological: Negative for dizziness, focal weakness, seizures, facial asymmetry, speech difficulty, weakness and headaches.       Was tremulous when she awoke.   Psychiatric/Behavioral: Negative for confusion.     Physical Exam Updated Vital Signs BP 108/80   Pulse 76   Temp 98.8 F (37.1 C) (Oral)   Resp 18   Ht 5\' 1"  (1.549 m)   Wt 63 kg   LMP 04/08/2019   SpO2 100%   BMI 26.26 kg/m   Physical Exam Vitals signs and nursing note reviewed.  Constitutional:      General:  She is not in acute distress.    Appearance: She is normal weight.  HENT:     Head: Normocephalic and atraumatic.     Nose: Nose normal.     Mouth/Throat:     Mouth: Mucous membranes are moist.     Pharynx: Oropharynx is clear.  Eyes:     Extraocular Movements: Extraocular movements intact.     Conjunctiva/sclera: Conjunctivae normal.     Pupils: Pupils are equal, round, and reactive to light.  Neck:     Musculoskeletal: Normal range of motion and neck supple.  Cardiovascular:     Rate and Rhythm: Normal rate and regular rhythm.     Pulses:  Normal pulses.     Heart sounds: Normal heart sounds.  Pulmonary:     Effort: Pulmonary effort is normal.     Breath sounds: Normal breath sounds.  Abdominal:     General: Abdomen is flat. Bowel sounds are normal.     Tenderness: There is no abdominal tenderness. There is no guarding.  Musculoskeletal: Normal range of motion.  Skin:    General: Skin is warm and dry.     Capillary Refill: Capillary refill takes less than 2 seconds.  Neurological:     General: No focal deficit present.     Mental Status: She is alert and oriented to person, place, and time.     Cranial Nerves: No cranial nerve deficit.     Deep Tendon Reflexes: Reflexes normal.  Psychiatric:        Mood and Affect: Mood normal.        Behavior: Behavior normal.      ED Treatments / Results  Labs (all labs ordered are listed, but only abnormal results are displayed) Results for orders placed or performed during the hospital encounter of 04/21/19  Basic metabolic panel  Result Value Ref Range   Sodium 138 135 - 145 mmol/L   Potassium 3.6 3.5 - 5.1 mmol/L   Chloride 104 98 - 111 mmol/L   CO2 25 22 - 32 mmol/L   Glucose, Bld 107 (H) 70 - 99 mg/dL   BUN 13 6 - 20 mg/dL   Creatinine, Ser 8.50 0.44 - 1.00 mg/dL   Calcium 9.3 8.9 - 27.7 mg/dL   GFR calc non Af Amer >60 >60 mL/min   GFR calc Af Amer >60 >60 mL/min   Anion gap 9 5 - 15  CBC  Result Value Ref Range   WBC 12.6 (H) 4.0 - 10.5 K/uL   RBC 4.39 3.87 - 5.11 MIL/uL   Hemoglobin 13.1 12.0 - 15.0 g/dL   HCT 41.2 87.8 - 67.6 %   MCV 94.5 80.0 - 100.0 fL   MCH 29.8 26.0 - 34.0 pg   MCHC 31.6 30.0 - 36.0 g/dL   RDW 72.0 94.7 - 09.6 %   Platelets 255 150 - 400 K/uL   nRBC 0.0 0.0 - 0.2 %  Urinalysis, Routine w reflex microscopic  Result Value Ref Range   Color, Urine YELLOW YELLOW   APPearance CLEAR CLEAR   Specific Gravity, Urine 1.020 1.005 - 1.030   pH 6.0 5.0 - 8.0   Glucose, UA NEGATIVE NEGATIVE mg/dL   Hgb urine dipstick MODERATE (A) NEGATIVE    Bilirubin Urine NEGATIVE NEGATIVE   Ketones, ur NEGATIVE NEGATIVE mg/dL   Protein, ur NEGATIVE NEGATIVE mg/dL   Nitrite NEGATIVE NEGATIVE   Leukocytes,Ua NEGATIVE NEGATIVE  Pregnancy, urine  Result Value Ref Range   Preg Test, Ur NEGATIVE  NEGATIVE  Urinalysis, Microscopic (reflex)  Result Value Ref Range   RBC / HPF 6-10 0 - 5 RBC/hpf   WBC, UA 0-5 0 - 5 WBC/hpf   Bacteria, UA FEW (A) NONE SEEN   Squamous Epithelial / LPF 0-5 0 - 5   No results found.  EKG EKG Interpretation  Date/Time:  Monday April 21 2019 20:05:02 EDT Ventricular Rate:  83 PR Interval:  144 QRS Duration: 70 QT Interval:  354 QTC Calculation: 415 R Axis:   58 Text Interpretation:  Normal sinus rhythm Confirmed by Nicanor AlconPalumbo, Donovyn Guidice ([id number]) on 04/21/2019 11:07:25 PM   Radiology No results found.  Procedures Procedures (including critical care time)  I suspect this was a vasovagal episode brought on by stress at work.  She came to and states she was still tremulous.  There are no signs of Brugada nor IHHS on the EKG.  The patient is not orthostatic.  She has a normal neurologic exam.  She is not on an OCP, no leg swelling or pain. No trauma, no surgery.  PERC negative wells 0 highly doubt PE in this low risk patient.  No signs of infection.  No HA. No seizure like activity nor loss of bowel of bladder continence.  Strict return precautions given.  Patient PO challenged successfully in the ED.   Kristy AsalMarquia Terrones was evaluated in Emergency Department on 04/22/2019 for the symptoms described in the history of present illness. She was evaluated in the context of the global COVID-19 pandemic, which necessitated consideration that the patient might be at risk for infection with the SARS-CoV-2 virus that causes COVID-19. Institutional protocols and algorithms that pertain to the evaluation of patients at risk for COVID-19 are in a state of rapid change based on information released by regulatory bodies including the CDC  and federal and state organizations. These policies and algorithms were followed during the patient's care in the ED.   Final Clinical Impressions(s) / ED Diagnoses   Final diagnoses:  Vasovagal episode    Return for intractable cough, coughing up blood,fevers >100.4 unrelieved by medication, shortness of breath, intractable vomiting, chest pain, shortness of breath, weakness,numbness, changes in speech, facial asymmetry,abdominal pain, passing out,Inability to tolerate liquids or food, cough, altered mental status or any concerns. No signs of systemic illness or infection. The patient is nontoxic-appearing on exam and vital signs are within normal limits.   I have reviewed the triage vital signs and the nursing notes. Pertinent labs &imaging results that were available during my care of the patient were reviewed by me and considered in my medical decision making (see chart for details).After history, exam, and medical workup I feel the patient has beenappropriately medically screened and is safe for discharge home. Pertinent diagnoses were discussed with the patient. Patient was given return precautions.     Arminda Foglio, MD 04/22/19 [phone]

## 2021-12-26 ENCOUNTER — Encounter (HOSPITAL_BASED_OUTPATIENT_CLINIC_OR_DEPARTMENT_OTHER): Payer: Self-pay | Admitting: Emergency Medicine

## 2021-12-26 ENCOUNTER — Other Ambulatory Visit: Payer: Self-pay

## 2021-12-26 ENCOUNTER — Emergency Department (HOSPITAL_BASED_OUTPATIENT_CLINIC_OR_DEPARTMENT_OTHER): Payer: Medicaid Other

## 2021-12-26 ENCOUNTER — Emergency Department (HOSPITAL_BASED_OUTPATIENT_CLINIC_OR_DEPARTMENT_OTHER)
Admission: EM | Admit: 2021-12-26 | Discharge: 2021-12-26 | Disposition: A | Discharge status: Home / Self Care | Payer: Medicaid Other | Attending: Emergency Medicine | Admitting: Emergency Medicine

## 2021-12-26 DIAGNOSIS — S99922A Unspecified injury of left foot, initial encounter: Secondary | ICD-10-CM

## 2021-12-26 DIAGNOSIS — W52XXXA Crushed, pushed or stepped on by crowd or human stampede, initial encounter: Secondary | ICD-10-CM | POA: Insufficient documentation

## 2021-12-26 DIAGNOSIS — S96102A Unspecified injury of muscle and tendon of long extensor muscle of toe at ankle and foot level, left foot, initial encounter: Secondary | ICD-10-CM | POA: Diagnosis present

## 2021-12-26 MED ORDER — IBUPROFEN 400 MG PO TABS
600.0000 mg | ORAL_TABLET | Freq: Once | ORAL | Status: AC
  Administered 2021-12-26: 600 mg via ORAL
  Filled 2021-12-26: qty 1

## 2021-12-26 NOTE — ED Triage Notes (Signed)
Get stepped on left second toe, sts unable to bear weight on left foot .

## 2021-12-26 NOTE — ED Provider Notes (Signed)
[facility] DEPART[facility] Provider Note   CSN: [id number] Arrival date & time: 12/26/21  1052     History  Chief Complaint  Patient presents with   left toe injury    Kristy Byrd is a 36 y.o. female reporting an injury to her left fourth toe.  Reports her daughter stepped on her in her crocs and she has had severe pain ever since.  She is unable to let anything touch the area, even a blanket over her shoe.  Has been walking with a limp.  Tried to keep it elevated but no other at home treatment.  HPI     Home Medications Prior to Admission medications   Medication Sig Start Date End Date Taking? Authorizing Provider  methocarbamol (ROBAXIN) 500 MG tablet Take 1 tablet (500 mg total) by mouth every 8 (eight) hours as needed. 06/21/18   Petrucelli, Samantha R, PA-C  naproxen (NAPROSYN) 500 MG tablet Take 1 tablet (500 mg total) by mouth 2 (two) times daily. 06/21/18   Petrucelli, Glynda Jaeger, PA-C      Allergies    Betadine [povidone iodine]    Review of Systems   Review of Systems  Physical Exam Updated Vital Signs Ht 5\' 2"  (1.575 m)   Wt 74.4 kg   LMP 12/16/2021 (Exact Date)   BMI 30.00 kg/m  Physical Exam Vitals and nursing note reviewed.  Constitutional:      Appearance: Normal appearance.  HENT:     Head: Normocephalic and atraumatic.  Eyes:     General: No scleral icterus.    Conjunctiva/sclera: Conjunctivae normal.  Pulmonary:     Effort: Pulmonary effort is normal. No respiratory distress.  Musculoskeletal:     Comments: Swelling in the left toe over the proximal and distal phalanx.  Strong DP pulse and full range of motion of the ankle.  Range of motion of the fourth MTP limited secondary to pain.  No lacerations or bruising  Skin:    General: Skin is warm and dry.     Findings: No rash.  Neurological:     Mental Status: She is alert.     Sensory: No sensory deficit.  Psychiatric:        Mood and Affect: Mood normal.    ED  Results / Procedures / Treatments   Labs (all labs ordered are listed, but only abnormal results are displayed) Labs Reviewed - No data to display  EKG None  Radiology DG Toe 4th Left  Result Date: 12/26/2021 CLINICAL DATA:  Injury. Got stepped on. Pain. Unable to bear weight on left foot. EXAM: LEFT FOURTH TOE COMPARISON:  None Available. FINDINGS: Normal bone mineralization. Mild sclerosis in the region of the mid shaft of the proximal phalanx of fourth toe, however this appears similar to the visualized third toe and likely represents the patient's normal anatomy. No acute fracture or dislocation. IMPRESSION: No acute fracture. Electronically Signed   By: Yvonne Kendall M.D.   On: 12/26/2021 11:41    Procedures Procedures   Medications Ordered in ED Medications  ibuprofen (ADVIL) tablet 600 mg (600 mg Oral Given 12/26/21 1112)    ED Course/ Medical Decision Making/ A&P                           Medical Decision Making Amount and/or Complexity of Data Reviewed Radiology: ordered.   36 year old female presented today after a left toe injury in which her daughter stepped on  her foot.  Was having difficulty bearing weight or having any object brushed her left fourth toe.  Imaging: Toe x-ray ordered, viewed and interpreted by me. negative  Treatment: 600 mg ibuprofen given and on reevaluation patient reports the pain is easing up  MDM/disposition: Patient remains neurovascularly intact, x-ray negative.  She will be placed in a walking boot and given instructions for NSAID and Tylenol therapy.  She will also do RICE therapy at home.  She is agreeable to this.  Given a follow-up for sports medicine in 1 to 2 weeks if nothing is changing.   Final Clinical Impression(s) / ED Diagnoses Final diagnoses:  Injury of toe on left foot, initial encounter    Rx / DC Orders   Results and diagnoses were explained to the patient. Return precautions discussed in full. Patient had no  additional questions and expressed complete understanding.   This chart was dictated using voice recognition software.  Despite best efforts to proofread,  errors can occur which can change the documentation meaning.    Rhae Hammock, PA-C 12/26/21 [place], [place], DO 12/26/21 1433

## 2021-12-26 NOTE — Discharge Instructions (Addendum)
Ibuprofen and Tylenol may both be utilized for pain, you may alternate these. There is a follow-up office to see if your injury is not getting better in 1 to 2 weeks.  Read the information about RICE therapy and return with any worsening symptoms

## 2024-01-05 IMAGING — DX DG TOE 4TH 2+V*L*
3 series · 3 of 3 positions shown · non-contrast
Comparison: None Available.

CLINICAL DATA: Injury. Got stepped on. Pain. Unable to bear weight
on left foot.

EXAM:
LEFT FOURTH TOE

[toe ap]
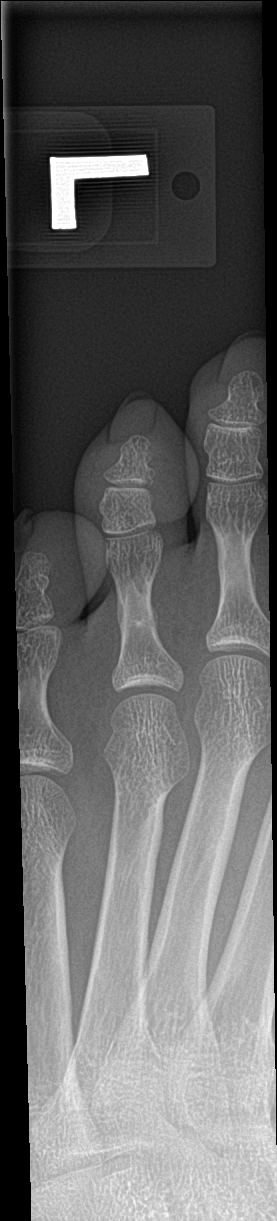

[toe obl]
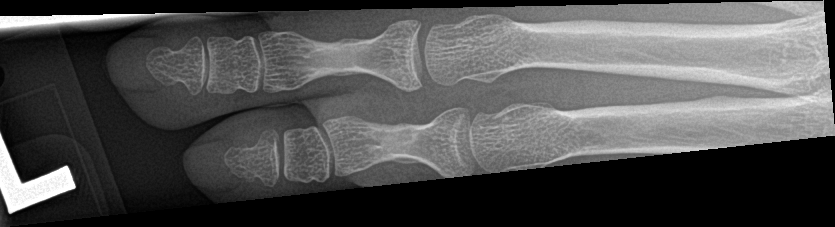

[toe lat]
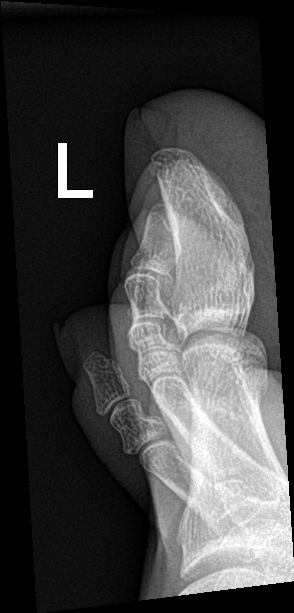

[3 of 3 positions shown; findings below may reference images not displayed]

FINDINGS: Normal bone mineralization. Mild sclerosis in the region of the mid
shaft of the proximal phalanx of fourth toe, however this appears
similar to the visualized third toe and likely represents the
patient's normal anatomy. No acute fracture or dislocation.
IMPRESSION: No acute fracture.
